# Patient Record
Sex: Male | Born: 1940
Health system: Southern US, Community
[De-identification: ages and names within clinical notes are randomized; demographics above are authoritative.]

## PROBLEM LIST (undated history)

## (undated) DIAGNOSIS — H269 Unspecified cataract: Secondary | ICD-10-CM

## (undated) DIAGNOSIS — G459 Transient cerebral ischemic attack, unspecified: Secondary | ICD-10-CM

## (undated) DIAGNOSIS — M1990 Unspecified osteoarthritis, unspecified site: Secondary | ICD-10-CM

## (undated) DIAGNOSIS — K5792 Diverticulitis of intestine, part unspecified, without perforation or abscess without bleeding: Secondary | ICD-10-CM

## (undated) DIAGNOSIS — J449 Chronic obstructive pulmonary disease, unspecified: Secondary | ICD-10-CM

## (undated) DIAGNOSIS — I10 Essential (primary) hypertension: Secondary | ICD-10-CM

## (undated) DIAGNOSIS — K5790 Diverticulosis of intestine, part unspecified, without perforation or abscess without bleeding: Secondary | ICD-10-CM

## (undated) DIAGNOSIS — E785 Hyperlipidemia, unspecified: Secondary | ICD-10-CM

## (undated) HISTORY — DX: Unspecified osteoarthritis, unspecified site: M19.90

## (undated) HISTORY — DX: Hyperlipidemia, unspecified: E78.5

## (undated) HISTORY — DX: Unspecified cataract: H26.9

## (undated) HISTORY — PX: COLONOSCOPY: SHX174

## (undated) HISTORY — DX: Diverticulitis of intestine, part unspecified, without perforation or abscess without bleeding: K57.92

## (undated) HISTORY — DX: Diverticulosis of intestine, part unspecified, without perforation or abscess without bleeding: K57.90

---

## 2006-07-30 HISTORY — PX: PENILE PROSTHESIS IMPLANT: SHX240

## 2011-09-12 DIAGNOSIS — G459 Transient cerebral ischemic attack, unspecified: Secondary | ICD-10-CM

## 2011-09-12 HISTORY — DX: Transient cerebral ischemic attack, unspecified: G45.9

## 2013-09-17 DIAGNOSIS — H2510 Age-related nuclear cataract, unspecified eye: Secondary | ICD-10-CM | POA: Diagnosis not present

## 2013-09-23 DIAGNOSIS — M47817 Spondylosis without myelopathy or radiculopathy, lumbosacral region: Secondary | ICD-10-CM | POA: Diagnosis not present

## 2013-09-23 DIAGNOSIS — H3400 Transient retinal artery occlusion, unspecified eye: Secondary | ICD-10-CM | POA: Diagnosis not present

## 2013-10-01 DIAGNOSIS — J449 Chronic obstructive pulmonary disease, unspecified: Secondary | ICD-10-CM | POA: Diagnosis not present

## 2013-10-01 DIAGNOSIS — J438 Other emphysema: Secondary | ICD-10-CM | POA: Diagnosis not present

## 2013-10-01 DIAGNOSIS — J0100 Acute maxillary sinusitis, unspecified: Secondary | ICD-10-CM | POA: Diagnosis not present

## 2013-10-01 DIAGNOSIS — R059 Cough, unspecified: Secondary | ICD-10-CM | POA: Diagnosis not present

## 2013-10-01 DIAGNOSIS — J209 Acute bronchitis, unspecified: Secondary | ICD-10-CM | POA: Diagnosis not present

## 2013-10-01 DIAGNOSIS — R05 Cough: Secondary | ICD-10-CM | POA: Diagnosis not present

## 2013-10-21 DIAGNOSIS — E7800 Pure hypercholesterolemia, unspecified: Secondary | ICD-10-CM | POA: Diagnosis not present

## 2013-10-21 DIAGNOSIS — I69998 Other sequelae following unspecified cerebrovascular disease: Secondary | ICD-10-CM | POA: Diagnosis not present

## 2013-10-21 DIAGNOSIS — R5381 Other malaise: Secondary | ICD-10-CM | POA: Diagnosis not present

## 2013-10-21 DIAGNOSIS — Z125 Encounter for screening for malignant neoplasm of prostate: Secondary | ICD-10-CM | POA: Diagnosis not present

## 2013-10-21 DIAGNOSIS — I10 Essential (primary) hypertension: Secondary | ICD-10-CM | POA: Diagnosis not present

## 2013-10-21 DIAGNOSIS — N529 Male erectile dysfunction, unspecified: Secondary | ICD-10-CM | POA: Diagnosis not present

## 2013-10-21 DIAGNOSIS — R351 Nocturia: Secondary | ICD-10-CM | POA: Diagnosis not present

## 2013-10-21 DIAGNOSIS — R5383 Other fatigue: Secondary | ICD-10-CM | POA: Diagnosis not present

## 2013-10-21 DIAGNOSIS — Z0000 Encounter for general adult medical examination without abnormal findings: Secondary | ICD-10-CM | POA: Diagnosis not present

## 2013-10-21 DIAGNOSIS — M159 Polyosteoarthritis, unspecified: Secondary | ICD-10-CM | POA: Diagnosis not present

## 2013-10-21 DIAGNOSIS — R9431 Abnormal electrocardiogram [ECG] [EKG]: Secondary | ICD-10-CM | POA: Diagnosis not present

## 2013-10-23 DIAGNOSIS — I10 Essential (primary) hypertension: Secondary | ICD-10-CM | POA: Diagnosis not present

## 2013-10-23 DIAGNOSIS — H60399 Other infective otitis externa, unspecified ear: Secondary | ICD-10-CM | POA: Diagnosis not present

## 2014-04-21 DIAGNOSIS — I10 Essential (primary) hypertension: Secondary | ICD-10-CM | POA: Diagnosis not present

## 2014-04-21 DIAGNOSIS — L500 Allergic urticaria: Secondary | ICD-10-CM | POA: Diagnosis not present

## 2014-04-28 DIAGNOSIS — L500 Allergic urticaria: Secondary | ICD-10-CM | POA: Diagnosis not present

## 2014-04-28 DIAGNOSIS — I10 Essential (primary) hypertension: Secondary | ICD-10-CM | POA: Diagnosis not present

## 2014-04-28 DIAGNOSIS — Z91038 Other insect allergy status: Secondary | ICD-10-CM | POA: Diagnosis not present

## 2014-07-02 DIAGNOSIS — Z23 Encounter for immunization: Secondary | ICD-10-CM | POA: Diagnosis not present

## 2014-09-01 DIAGNOSIS — G453 Amaurosis fugax: Secondary | ICD-10-CM | POA: Diagnosis not present

## 2014-09-18 DIAGNOSIS — H53022 Refractive amblyopia, left eye: Secondary | ICD-10-CM | POA: Diagnosis not present

## 2014-09-18 DIAGNOSIS — H43391 Other vitreous opacities, right eye: Secondary | ICD-10-CM | POA: Diagnosis not present

## 2014-09-18 DIAGNOSIS — H2513 Age-related nuclear cataract, bilateral: Secondary | ICD-10-CM | POA: Diagnosis not present

## 2014-09-18 DIAGNOSIS — H524 Presbyopia: Secondary | ICD-10-CM | POA: Diagnosis not present

## 2014-09-29 DIAGNOSIS — I10 Essential (primary) hypertension: Secondary | ICD-10-CM | POA: Diagnosis not present

## 2014-09-29 DIAGNOSIS — M542 Cervicalgia: Secondary | ICD-10-CM | POA: Diagnosis not present

## 2014-09-29 DIAGNOSIS — M25511 Pain in right shoulder: Secondary | ICD-10-CM | POA: Diagnosis not present

## 2014-10-12 DIAGNOSIS — R000 Tachycardia, unspecified: Secondary | ICD-10-CM | POA: Diagnosis not present

## 2014-10-12 DIAGNOSIS — R609 Edema, unspecified: Secondary | ICD-10-CM | POA: Diagnosis not present

## 2014-10-12 DIAGNOSIS — E780 Pure hypercholesterolemia: Secondary | ICD-10-CM | POA: Diagnosis not present

## 2014-10-12 DIAGNOSIS — I10 Essential (primary) hypertension: Secondary | ICD-10-CM | POA: Diagnosis not present

## 2014-10-12 DIAGNOSIS — J449 Chronic obstructive pulmonary disease, unspecified: Secondary | ICD-10-CM | POA: Diagnosis not present

## 2014-10-12 DIAGNOSIS — R0602 Shortness of breath: Secondary | ICD-10-CM | POA: Diagnosis not present

## 2014-10-12 DIAGNOSIS — R209 Unspecified disturbances of skin sensation: Secondary | ICD-10-CM | POA: Diagnosis not present

## 2014-10-12 DIAGNOSIS — N401 Enlarged prostate with lower urinary tract symptoms: Secondary | ICD-10-CM | POA: Diagnosis not present

## 2014-10-12 DIAGNOSIS — Z79899 Other long term (current) drug therapy: Secondary | ICD-10-CM | POA: Diagnosis not present

## 2014-10-12 DIAGNOSIS — R05 Cough: Secondary | ICD-10-CM | POA: Diagnosis not present

## 2014-10-12 DIAGNOSIS — Z8673 Personal history of transient ischemic attack (TIA), and cerebral infarction without residual deficits: Secondary | ICD-10-CM | POA: Diagnosis not present

## 2014-10-14 DIAGNOSIS — R609 Edema, unspecified: Secondary | ICD-10-CM | POA: Diagnosis not present

## 2014-10-14 DIAGNOSIS — R000 Tachycardia, unspecified: Secondary | ICD-10-CM | POA: Diagnosis not present

## 2014-10-26 DIAGNOSIS — I10 Essential (primary) hypertension: Secondary | ICD-10-CM | POA: Diagnosis not present

## 2014-10-26 DIAGNOSIS — K5790 Diverticulosis of intestine, part unspecified, without perforation or abscess without bleeding: Secondary | ICD-10-CM | POA: Diagnosis not present

## 2014-10-26 DIAGNOSIS — N400 Enlarged prostate without lower urinary tract symptoms: Secondary | ICD-10-CM | POA: Diagnosis not present

## 2014-10-26 DIAGNOSIS — Z0001 Encounter for general adult medical examination with abnormal findings: Secondary | ICD-10-CM | POA: Diagnosis not present

## 2014-10-26 DIAGNOSIS — E780 Pure hypercholesterolemia: Secondary | ICD-10-CM | POA: Diagnosis not present

## 2014-10-26 DIAGNOSIS — Z8673 Personal history of transient ischemic attack (TIA), and cerebral infarction without residual deficits: Secondary | ICD-10-CM | POA: Diagnosis not present

## 2014-10-26 DIAGNOSIS — N529 Male erectile dysfunction, unspecified: Secondary | ICD-10-CM | POA: Diagnosis not present

## 2014-10-26 DIAGNOSIS — R635 Abnormal weight gain: Secondary | ICD-10-CM | POA: Diagnosis not present

## 2014-10-26 DIAGNOSIS — M150 Primary generalized (osteo)arthritis: Secondary | ICD-10-CM | POA: Diagnosis not present

## 2014-10-30 DIAGNOSIS — I361 Nonrheumatic tricuspid (valve) insufficiency: Secondary | ICD-10-CM | POA: Diagnosis not present

## 2014-10-30 DIAGNOSIS — I517 Cardiomegaly: Secondary | ICD-10-CM | POA: Diagnosis not present

## 2014-11-14 DIAGNOSIS — S61256A Open bite of right little finger without damage to nail, initial encounter: Secondary | ICD-10-CM | POA: Diagnosis not present

## 2014-11-14 DIAGNOSIS — S60571A Other superficial bite of hand of right hand, initial encounter: Secondary | ICD-10-CM | POA: Diagnosis not present

## 2014-11-14 DIAGNOSIS — W540XXA Bitten by dog, initial encounter: Secondary | ICD-10-CM | POA: Diagnosis not present

## 2014-11-14 DIAGNOSIS — Z87820 Personal history of traumatic brain injury: Secondary | ICD-10-CM | POA: Diagnosis not present

## 2014-11-14 DIAGNOSIS — S61250A Open bite of right index finger without damage to nail, initial encounter: Secondary | ICD-10-CM | POA: Diagnosis not present

## 2014-11-14 DIAGNOSIS — S62660B Nondisplaced fracture of distal phalanx of right index finger, initial encounter for open fracture: Secondary | ICD-10-CM | POA: Diagnosis not present

## 2014-11-14 DIAGNOSIS — I10 Essential (primary) hypertension: Secondary | ICD-10-CM | POA: Diagnosis not present

## 2014-11-14 DIAGNOSIS — S61252A Open bite of right middle finger without damage to nail, initial encounter: Secondary | ICD-10-CM | POA: Diagnosis not present

## 2014-11-14 DIAGNOSIS — S62600A Fracture of unspecified phalanx of right index finger, initial encounter for closed fracture: Secondary | ICD-10-CM | POA: Diagnosis not present

## 2014-11-14 DIAGNOSIS — Y999 Unspecified external cause status: Secondary | ICD-10-CM | POA: Diagnosis not present

## 2014-11-25 DIAGNOSIS — S62630D Displaced fracture of distal phalanx of right index finger, subsequent encounter for fracture with routine healing: Secondary | ICD-10-CM | POA: Insufficient documentation

## 2014-12-17 DIAGNOSIS — S62630D Displaced fracture of distal phalanx of right index finger, subsequent encounter for fracture with routine healing: Secondary | ICD-10-CM | POA: Diagnosis not present

## 2015-06-30 DIAGNOSIS — Z23 Encounter for immunization: Secondary | ICD-10-CM | POA: Diagnosis not present

## 2015-08-27 DIAGNOSIS — I6529 Occlusion and stenosis of unspecified carotid artery: Secondary | ICD-10-CM | POA: Diagnosis not present

## 2015-08-27 DIAGNOSIS — G453 Amaurosis fugax: Secondary | ICD-10-CM | POA: Diagnosis not present

## 2015-09-01 ENCOUNTER — Emergency Department (HOSPITAL_BASED_OUTPATIENT_CLINIC_OR_DEPARTMENT_OTHER): Payer: Medicare Other

## 2015-09-01 ENCOUNTER — Encounter (HOSPITAL_BASED_OUTPATIENT_CLINIC_OR_DEPARTMENT_OTHER): Payer: Self-pay | Admitting: *Deleted

## 2015-09-01 ENCOUNTER — Emergency Department (HOSPITAL_BASED_OUTPATIENT_CLINIC_OR_DEPARTMENT_OTHER)
Admission: EM | Admit: 2015-09-01 | Discharge: 2015-09-01 | Disposition: A | Discharge status: Home / Self Care | Payer: Medicare Other | Attending: Emergency Medicine | Admitting: Emergency Medicine

## 2015-09-01 DIAGNOSIS — Z791 Long term (current) use of non-steroidal anti-inflammatories (NSAID): Secondary | ICD-10-CM | POA: Insufficient documentation

## 2015-09-01 DIAGNOSIS — Z79899 Other long term (current) drug therapy: Secondary | ICD-10-CM | POA: Insufficient documentation

## 2015-09-01 DIAGNOSIS — R1032 Left lower quadrant pain: Secondary | ICD-10-CM | POA: Diagnosis present

## 2015-09-01 DIAGNOSIS — Z8673 Personal history of transient ischemic attack (TIA), and cerebral infarction without residual deficits: Secondary | ICD-10-CM | POA: Diagnosis not present

## 2015-09-01 DIAGNOSIS — Z7902 Long term (current) use of antithrombotics/antiplatelets: Secondary | ICD-10-CM | POA: Insufficient documentation

## 2015-09-01 DIAGNOSIS — K5732 Diverticulitis of large intestine without perforation or abscess without bleeding: Secondary | ICD-10-CM | POA: Diagnosis not present

## 2015-09-01 DIAGNOSIS — J449 Chronic obstructive pulmonary disease, unspecified: Secondary | ICD-10-CM | POA: Diagnosis not present

## 2015-09-01 DIAGNOSIS — I10 Essential (primary) hypertension: Secondary | ICD-10-CM | POA: Diagnosis not present

## 2015-09-01 HISTORY — DX: Transient cerebral ischemic attack, unspecified: G45.9

## 2015-09-01 HISTORY — DX: Chronic obstructive pulmonary disease, unspecified: J44.9

## 2015-09-01 HISTORY — DX: Essential (primary) hypertension: I10

## 2015-09-01 LAB — BASIC METABOLIC PANEL
Anion gap: 7 (ref 5–15)
BUN: 14 mg/dL (ref 6–20)
CALCIUM: 8.7 mg/dL — AB (ref 8.9–10.3)
CHLORIDE: 103 mmol/L (ref 101–111)
CO2: 26 mmol/L (ref 22–32)
CREATININE: 0.9 mg/dL (ref 0.61–1.24)
GFR calc non Af Amer: >60 mL/min (ref >60)
Glucose, Bld: 120 mg/dL — ABNORMAL HIGH (ref 65–99)
Potassium: 4.1 mmol/L (ref 3.5–5.1)
SODIUM: 136 mmol/L (ref 135–145)

## 2015-09-01 LAB — CBC WITH DIFFERENTIAL/PLATELET
BASOS PCT: 0 %
Basophils Absolute: 0 10*3/uL (ref 0.0–0.1)
EOS ABS: 0.2 10*3/uL (ref 0.0–0.7)
EOS PCT: 3 %
HCT: 44.3 % (ref 39.0–52.0)
HEMOGLOBIN: 14.9 g/dL (ref 13.0–17.0)
LYMPHS ABS: 1 10*3/uL (ref 0.7–4.0)
Lymphocytes Relative: 12 %
MCH: 31.6 pg (ref 26.0–34.0)
MCHC: 33.6 g/dL (ref 30.0–36.0)
MCV: 94.1 fL (ref 78.0–100.0)
MONO ABS: 1.2 10*3/uL — AB (ref 0.1–1.0)
MONOS PCT: 14 %
NEUTROS PCT: 71 %
Neutro Abs: 5.9 10*3/uL (ref 1.7–7.7)
PLATELETS: 295 10*3/uL (ref 150–400)
RBC: 4.71 MIL/uL (ref 4.22–5.81)
RDW: 12.2 % (ref 11.5–15.5)
WBC: 8.3 10*3/uL (ref 4.0–10.5)

## 2015-09-01 LAB — URINALYSIS, ROUTINE W REFLEX MICROSCOPIC
BILIRUBIN URINE: NEGATIVE
Glucose, UA: NEGATIVE mg/dL
HGB URINE DIPSTICK: NEGATIVE
KETONES UR: NEGATIVE mg/dL
Leukocytes, UA: NEGATIVE
NITRITE: NEGATIVE
PROTEIN: NEGATIVE mg/dL
SPECIFIC GRAVITY, URINE: 1.013 (ref 1.005–1.030)
pH: 6 (ref 5.0–8.0)

## 2015-09-01 MED ORDER — IOHEXOL 300 MG/ML  SOLN
100.0000 mL | Freq: Once | INTRAMUSCULAR | Status: AC | PRN
  Administered 2015-09-01: 100 mL via INTRAVENOUS

## 2015-09-01 MED ORDER — CIPROFLOXACIN HCL 500 MG PO TABS
500.0000 mg | ORAL_TABLET | Freq: Two times a day (BID) | ORAL | Status: AC
Start: 2015-09-01 — End: 2015-09-10

## 2015-09-01 MED ORDER — HYDROCODONE-ACETAMINOPHEN 5-325 MG PO TABS: 1.0000 | ORAL_TABLET | ORAL | Status: DC | PRN

## 2015-09-01 MED ORDER — METRONIDAZOLE 500 MG PO TABS: 500.0000 mg | ORAL_TABLET | Freq: Two times a day (BID) | ORAL | Status: AC

## 2015-09-01 MED ORDER — IOHEXOL 300 MG/ML  SOLN
25.0000 mL | Freq: Once | INTRAMUSCULAR | Status: AC | PRN
  Administered 2015-09-01: 25 mL via ORAL

## 2015-09-01 NOTE — ED Provider Notes (Signed)
CSN: [id number]     Arrival date & time 09/01/15  1126 History   First MD Initiated Contact with Patient 09/01/15 1135     No chief complaint on file.   HPI  Patient presents with concern of new abdominal pain. The pain began 4 days ago, since onset has been worsening. The pain is focally in the left lower quadrant, with no radiation. There is no shift nausea, vomiting, diarrhea, dysuria, hematuria. Patient has a history of prior penile implant. This was about 10 years ago. Since onset, no clear alleviating or exacerbating factors.   Past Medical History  Diagnosis Date  . Hypertension   . TIA (transient ischemic attack)   . COPD (chronic obstructive pulmonary disease) [facility])    Past Surgical History  Procedure Laterality Date  . Penile prosthesis implant     No family history on file. Social History  Substance Use Topics  . Smoking status: Never Smoker   . Smokeless tobacco: None  . Alcohol Use: None    Review of Systems  Constitutional:       Per HPI, otherwise negative  HENT:       Per HPI, otherwise negative  Respiratory:       Per HPI, otherwise negative  Cardiovascular:       Per HPI, otherwise negative  Gastrointestinal: Negative for vomiting.  Endocrine:       Negative aside from HPI  Genitourinary:       Neg aside from HPI   Musculoskeletal:       Per HPI, otherwise negative  Skin: Negative.   Neurological: Negative for syncope.      Allergies  Morphine and related  Home Medications   Prior to Admission medications   Medication Sig Start Date End Date Taking? Authorizing Provider  amLODipine (NORVASC) 5 MG tablet Take 5 mg by mouth daily.   Yes Historical Provider, MD  ciprofloxacin (CIPRO) 500 MG tablet Take 1 tablet (500 mg total) by mouth 2 (two) times daily. 09/01/15 09/10/15  Carmin Muskrat, MD  clopidogrel (PLAVIX) 75 MG tablet Take 75 mg by mouth daily.   Yes Historical Provider, MD  HYDROcodone-acetaminophen (NORCO/VICODIN) 5-325 MG  tablet Take 1 tablet by mouth every 4 (four) hours as needed for severe pain. 09/01/15   Carmin Muskrat, MD  meloxicam (MOBIC) 15 MG tablet Take 15 mg by mouth daily.   Yes Historical Provider, MD  metroNIDAZOLE (FLAGYL) 500 MG tablet Take 1 tablet (500 mg total) by mouth 2 (two) times daily. 09/01/15 09/11/15  Carmin Muskrat, MD  rosuvastatin (CRESTOR) 10 MG tablet Take 10 mg by mouth daily.   Yes Historical Provider, MD   BP 146/89 mmHg  Pulse 94  Temp(Src) 98.3 F (36.8 C) (Oral)  Resp 20  Ht 5\' 8"  (1.727 m)  Wt 198 lb (89.812 kg)  BMI 30.11 kg/m2  SpO2 98% Physical Exam  Constitutional: He is oriented to person, place, and time. He appears well-developed. No distress.  HENT:  Head: Normocephalic and atraumatic.  Eyes: Conjunctivae and EOM are normal.  Cardiovascular: Normal rate and regular rhythm.   Pulmonary/Chest: Effort normal. No stridor. No respiratory distress.  Abdominal: He exhibits no distension. There is tenderness in the left lower quadrant. There is guarding. There is no rigidity.  Musculoskeletal: He exhibits no edema.  Neurological: He is alert and oriented to person, place, and time.  Skin: Skin is warm and dry.  Psychiatric: He has a normal mood and affect.  Nursing note and vitals  reviewed.   ED Course  Procedures (including critical care time) Labs Review Labs Reviewed  BASIC METABOLIC PANEL - Abnormal; Notable for the following:    Glucose, Bld 120 (*)    Calcium 8.7 (*)    All other components within normal limits  CBC WITH DIFFERENTIAL/PLATELET - Abnormal; Notable for the following:    Monocytes Absolute 1.2 (*)    All other components within normal limits  URINALYSIS, ROUTINE W REFLEX MICROSCOPIC (NOT AT [facility])    Imaging Review Ct Abdomen Pelvis W Contrast  09/01/2015  CLINICAL DATA:  Left lower quadrant pain for 3 days EXAM: CT ABDOMEN AND PELVIS WITH CONTRAST TECHNIQUE: Multidetector CT imaging of the abdomen and pelvis was performed using  the standard protocol following bolus administration of intravenous contrast. CONTRAST:  138mL OMNIPAQUE IOHEXOL 300 MG/ML SOLN, 40mL OMNIPAQUE IOHEXOL 300 MG/ML SOLN COMPARISON:  None. FINDINGS: The lung bases are free of acute infiltrate or sizable effusion. The liver, gallbladder, spleen, left adrenal gland and pancreas are within normal limits with the exception of a small hepatic cyst. A small nodule is noted in the right adrenal gland best seen on image number 25 of series 2 measuring less than 1 cm and likely representing a small adenoma. Kidneys are well visualized bilaterally. No renal calculi or obstructive changes are noted. No abnormal enhancement is seen. The appendix is within normal limits. Diverticular change of the colon is seen with evidence of diverticulitis in the sigmoid colon proximally. No micro perforation or abscess is identified at this time. The bladder is well distended. Changes of penile prosthesis are seen. No pelvic mass lesion is noted. Mild aortic calcifications are seen. Degenerative changes of the lumbar spine are noted. IMPRESSION: Diverticulitis within the sigmoid colon without evidence of abscess or perforation. Likely small right adrenal adenoma. Tiny cyst within the liver. Electronically Signed   By: Inez Catalina M.D.   On: 09/01/2015 13:44   I have personally reviewed and evaluated these images and lab results as part of my medical decision-making.  2:08 PM On repeat exam the patient is awake, alert, aware of all findings.  We discussed return precautions, INSTRUCTIONS at length.   MDM   Final diagnoses:  Diverticulitis of large intestine without perforation or abscess without bleeding   Patient presents with persistent left lower quadrant abdominal pain. Here, the patient is afebrile, with no evidence for peritonitis. With his history of prior procedures, broad differential was considered. CT findings are consistent with diverticulitis. Patient aware of all  results, will follow-up with gastroenterology, after initiation of antibiotics analgesia.   Carmin Muskrat, MD 09/01/15 1409

## 2015-09-01 NOTE — Discharge Instructions (Signed)
Diverticulitis Diverticulitis is when small pockets that have formed in your colon (large intestine) become infected or swollen. HOME CARE  Follow your doctor's instructions.  Follow a special diet if told by your doctor.  When you feel better, your doctor may tell you to change your diet. You may be told to eat a lot of fiber. Fruits and vegetables are good sources of fiber. Fiber makes it easier to poop (have bowel movements).  Take supplements or probiotics as told by your doctor.  Only take medicines as told by your doctor.  Keep all follow-up visits with your doctor. GET HELP IF:  Your pain does not get better.  You have a hard time eating food.  You are not pooping like normal. GET HELP RIGHT AWAY IF:  Your pain gets worse.  Your problems do not get better.  Your problems suddenly get worse.  You have a fever.  You keep throwing up (vomiting).  You have bloody or Smithey, tarry poop (stool). MAKE SURE YOU:   Understand these instructions.  Will watch your condition.  Will get help right away if you are not doing well or get worse.   This information is not intended to replace advice given to you by your health care provider. Make sure you discuss any questions you have with your health care provider.   Document Released: 02/14/2008 Document Revised: 09/02/2013 Document Reviewed: 07/23/2013

## 2015-09-01 NOTE — ED Notes (Signed)
C/o lower left abd pain with tenderness on palpation that started on Sunday. No n/v/d. No problems urinating. No hx of kidney stones.

## 2015-09-02 ENCOUNTER — Telehealth: Payer: Self-pay | Admitting: Gastroenterology

## 2015-09-02 NOTE — Telephone Encounter (Signed)
Spoke with the spouse. Appointment scheduled with Connor Johnson. Patient does not have any GI history. He has never had a colonoscopy.

## 2015-09-16 ENCOUNTER — Encounter: Payer: Self-pay | Admitting: Physician Assistant

## 2015-09-16 ENCOUNTER — Ambulatory Visit (INDEPENDENT_AMBULATORY_CARE_PROVIDER_SITE_OTHER): Payer: Medicare Other | Admitting: Physician Assistant

## 2015-09-16 VITALS — BP 134/60 | HR 104 | Ht 66.5 in | Wt 203.2 lb

## 2015-09-16 DIAGNOSIS — G459 Transient cerebral ischemic attack, unspecified: Secondary | ICD-10-CM | POA: Diagnosis not present

## 2015-09-16 DIAGNOSIS — K5732 Diverticulitis of large intestine without perforation or abscess without bleeding: Secondary | ICD-10-CM

## 2015-09-16 DIAGNOSIS — I10 Essential (primary) hypertension: Secondary | ICD-10-CM | POA: Diagnosis not present

## 2015-09-16 DIAGNOSIS — Z1211 Encounter for screening for malignant neoplasm of colon: Secondary | ICD-10-CM | POA: Diagnosis not present

## 2015-09-16 MED ORDER — NA SULFATE-K SULFATE-MG SULF 17.5-3.13-1.6 GM/177ML PO SOLN: 1.0000 | Freq: Once | ORAL | Status: DC

## 2015-09-16 NOTE — Progress Notes (Signed)
Agree with assessment and plan as outlined.  

## 2015-09-16 NOTE — Patient Instructions (Addendum)
You have been scheduled for a colonoscopy. Please follow written instructions given to you at your visit today.  Please pick up your prep supplies at the pharmacy within the next 1-3 days. [place]. If you use inhalers (even only as needed), please bring them with you on the day of your procedure. Your physician has requested that you go to www.startemmi.com and enter the access code given to you at your visit today. This web site gives a general overview about your procedure. However, you should still follow specific instructions given to you by our office regarding your preparation for the procedure.  We will call you once we hear from Dr. Glendon Axe , [facility].

## 2015-09-16 NOTE — Progress Notes (Signed)
Patient ID: Jerson Furukawa, male   DOB: Mar 27, 1941, 75 y.o.   MRN: [id number]   Subjective:    Patient ID: Rennis Harding, male    DOB: June 25, 1941, 75 y.o.   MRN: [id number]  HPI  Ricahrd is a pleasant 75 year old white male new to GI today and seen after recent ER evaluation with diagnosis of acute diverticulitis. Patient has not had any prior GI evaluation. He thinks he may have had a sigmoid exam 15-20 years ago done by a primary care physician.  patient had an ER visit on 09/01/2015 with 4 day history of lower abdominal pain primarily in the left lower quadrant which was progressive. He had CT of the abdomen and pelvis done at that time which showed a 1 cm right adrenal cyst and uncomplicated sigmoid diverticulitis. WBC was 8.3 hemoglobin 14.9 hematocrit of 44.3. He was placed on a course of Cipro 500 twice a day and Flagyl twice a day 10 days which she has now completed.  He says initially his pain was and "8" and now it is about completely gone. He did have some loose stools while on the antibiotics and that has improved as well Again no residual discomfort no melena or hematochezia.   patient does have history of a TIA 6-7 years ago and has been maintained on Plavix since. He is followed by Dr. Chauncey [facility] neurology. Other medical problems include hypertension and COPD.  Review of Systems Pertinent positive and negative review of systems were noted in the above HPI section.  All other review of systems was otherwise negative.  Outpatient Encounter Prescriptions as of 09/16/2015  Medication Sig  . amLODipine (NORVASC) 5 MG tablet Take 5 mg by mouth daily.  . clopidogrel (PLAVIX) 75 MG tablet Take 75 mg by mouth daily.  . meloxicam (MOBIC) 15 MG tablet Take 15 mg by mouth daily.  . Na Sulfate-K Sulfate-Mg Sulf SOLN Take 1 kit by mouth once.  . rosuvastatin (CRESTOR) 10 MG tablet Take 10 mg by mouth daily.  . [DISCONTINUED] HYDROcodone-acetaminophen (NORCO/VICODIN) 5-325 MG tablet Take 1 tablet  by mouth every 4 (four) hours as needed for severe pain.   No facility-administered encounter medications on file as of 09/16/2015.   Allergies  Allergen Reactions  . Morphine And Related Other (See Comments)    Tremors and delirious  . Hctz [Hydrochlorothiazide] Rash   Patient Active Problem List   Diagnosis Date Noted  . HTN (hypertension) 09/16/2015  . Diverticulitis of colon 09/16/2015  . TIA (transient ischemic attack) 09/16/2015   Social History   Social History  . Marital Status: Married    Spouse Name: N/A  . Number of Children: 3  . Years of Education: N/A   Occupational History  . retired    Social History Main Topics  . Smoking status: Former Smoker    Types: Cigarettes    Quit date: 09/16/1995  . Smokeless tobacco: Never Used  . Alcohol Use: No  . Drug Use: No  . Sexual Activity: Not on file   Other Topics Concern  . Not on file   Social History Narrative    Mr. Degollado family history includes Diabetes in his brother; Heart disease in his paternal grandmother; Liver cancer in his father; Prostate cancer in his brother.      Objective:    Filed Vitals:   09/16/15 1326  BP: 134/60  Pulse: 104    Physical Exam   Well-developed older white male in no acute distress, accompanied by  his wife blood pressure 134/60 pulse 104 height 5 foot 6 weight 203. HEENT; nontraumatic normocephalic EOMI PERRLA sclera anicteric , cardiovascular regular rate and rhythm with S1-S2 no murmur or gallop, Pulmonary; clear bilaterally, Abdomen; large soft basically nontender no palpable mass or hepatosplenomegaly bowel sounds are present, Rectal; exam not done, Ext; no clubbing cyanosis or edema skin warm and dry, Neuropsych; mood and affect appropriate       Assessment & Plan:    #1 75 yo male with recent acute sigmoid diverticulitis- resolved. This was his first episode #2 Colon neoplasia screening- no prior colonoscopy #3 HTN #4 COPD- no meds or oxygen #5 Hx TIA -6-7  years ago - on Plavix  Plan;  High fiber diet, avoid popcorn and nuts Discussed Diverticular disease and diverticulitis Schedule for Colonoscopy with Dr Havery Moros (pt request) >procedure discussed in detail  with pt and he is agreeable to proceed. He is advised to call for any recurrence of symptoms in the interim. We will communicate with his  Neurologist Dr. Chauncey [facility] neurology  To assure that holding Plavix for 5 days prior to colonoscopy is reasonable for this patient. He may take  baby aspirin.   Patrich Heinze S Janayah Zavada PA-C 09/16/2015   Cc: Charleston Poot, MD

## 2015-09-21 DIAGNOSIS — H5203 Hypermetropia, bilateral: Secondary | ICD-10-CM | POA: Diagnosis not present

## 2015-09-21 DIAGNOSIS — H2513 Age-related nuclear cataract, bilateral: Secondary | ICD-10-CM | POA: Diagnosis not present

## 2015-09-21 DIAGNOSIS — H43391 Other vitreous opacities, right eye: Secondary | ICD-10-CM | POA: Diagnosis not present

## 2015-09-21 DIAGNOSIS — H53022 Refractive amblyopia, left eye: Secondary | ICD-10-CM | POA: Diagnosis not present

## 2015-09-22 ENCOUNTER — Telehealth: Payer: Self-pay | Admitting: *Deleted

## 2015-09-22 NOTE — Telephone Encounter (Signed)
Advised the patients wife he can hold the Plavix today and resume it the day after the procedure unless told otherwise by Dr. Havery Moros.  I just got the fax from Dr. Marijean Bravo at [facility]. Off Plavix 5 days prior to procedure.   ( DPR signed for [employer], patient's wife ).

## 2015-09-27 ENCOUNTER — Encounter: Payer: Self-pay | Admitting: Gastroenterology

## 2015-09-27 ENCOUNTER — Ambulatory Visit (AMBULATORY_SURGERY_CENTER): Payer: Medicare Other | Admitting: Gastroenterology

## 2015-09-27 VITALS — BP 132/82 | HR 77 | Temp 97.4°F | Resp 22 | Ht 66.5 in | Wt 203.0 lb

## 2015-09-27 DIAGNOSIS — D125 Benign neoplasm of sigmoid colon: Secondary | ICD-10-CM | POA: Diagnosis not present

## 2015-09-27 DIAGNOSIS — Z1211 Encounter for screening for malignant neoplasm of colon: Secondary | ICD-10-CM | POA: Diagnosis not present

## 2015-09-27 DIAGNOSIS — D128 Benign neoplasm of rectum: Secondary | ICD-10-CM

## 2015-09-27 DIAGNOSIS — D124 Benign neoplasm of descending colon: Secondary | ICD-10-CM

## 2015-09-27 DIAGNOSIS — K5732 Diverticulitis of large intestine without perforation or abscess without bleeding: Secondary | ICD-10-CM | POA: Diagnosis not present

## 2015-09-27 DIAGNOSIS — D120 Benign neoplasm of cecum: Secondary | ICD-10-CM | POA: Diagnosis not present

## 2015-09-27 DIAGNOSIS — D123 Benign neoplasm of transverse colon: Secondary | ICD-10-CM | POA: Diagnosis not present

## 2015-09-27 DIAGNOSIS — D122 Benign neoplasm of ascending colon: Secondary | ICD-10-CM

## 2015-09-27 DIAGNOSIS — Z8673 Personal history of transient ischemic attack (TIA), and cerebral infarction without residual deficits: Secondary | ICD-10-CM | POA: Diagnosis not present

## 2015-09-27 DIAGNOSIS — I10 Essential (primary) hypertension: Secondary | ICD-10-CM | POA: Diagnosis not present

## 2015-09-27 DIAGNOSIS — J449 Chronic obstructive pulmonary disease, unspecified: Secondary | ICD-10-CM | POA: Diagnosis not present

## 2015-09-27 LAB — HM COLONOSCOPY

## 2015-09-27 MED ORDER — SODIUM CHLORIDE 0.9 % IV SOLN: 500.0000 mL | INTRAVENOUS | Status: DC

## 2015-09-27 NOTE — Progress Notes (Signed)
Called to room to assist during endoscopic procedure.  Patient ID and intended procedure confirmed with present staff. Received instructions for my participation in the procedure from the performing physician.  

## 2015-09-27 NOTE — Patient Instructions (Signed)
Discharge instructions given. Handouts on polyps and diverticulosis. Resume PLavix in 3 days. Hold all other NSAIDS including meloxicam for  2 weeks. Resume other medications today. YOU HAD AN ENDOSCOPIC PROCEDURE TODAY AT [facility]:   Refer to the procedure report that was given to you for any specific questions about what was found during the examination.  If the procedure report does not answer your questions, please call your gastroenterologist to clarify.  If you requested that your care partner not be given the details of your procedure findings, then the procedure report has been included in a sealed envelope for you to review at your convenience later.  YOU SHOULD EXPECT: Some feelings of bloating in the abdomen. Passage of more gas than usual.  Walking can help get rid of the air that was put into your GI tract during the procedure and reduce the bloating. If you had a lower endoscopy (such as a colonoscopy or flexible sigmoidoscopy) you may notice spotting of blood in your stool or on the toilet paper. If you underwent a bowel prep for your procedure, you may not have a normal bowel movement for a few days.  Please Note:  You might notice some irritation and congestion in your nose or some drainage.  This is from the oxygen used during your procedure.  There is no need for concern and it should clear up in a day or so.  SYMPTOMS TO REPORT IMMEDIATELY:   Following lower endoscopy (colonoscopy or flexible sigmoidoscopy):  Excessive amounts of blood in the stool  Significant tenderness or worsening of abdominal pains  Swelling of the abdomen that is new, acute  Fever of 100F or higher   For urgent or emergent issues, a gastroenterologist can be reached at any hour by calling [phone].   DIET: Your first meal following the procedure should be a small meal and then it is ok to progress to your normal diet. Heavy or fried foods are harder to digest and may make  you feel nauseous or bloated.  Likewise, meals heavy in dairy and vegetables can increase bloating.  Drink plenty of fluids but you should avoid alcoholic beverages for 24 hours.  ACTIVITY:  You should plan to take it easy for the rest of today and you should NOT DRIVE or use heavy machinery until tomorrow (because of the sedation medicines used during the test).    FOLLOW UP: Our staff will call the number listed on your records the next business day following your procedure to check on you and address any questions or concerns that you may have regarding the information given to you following your procedure. If we do not reach you, we will leave a message.  However, if you are feeling well and you are not experiencing any problems, there is no need to return our call.  We will assume that you have returned to your regular daily activities without incident.  If any biopsies were taken you will be contacted by phone or by letter within the next 1-3 weeks.  Please call us at [phone] if you have not heard about the biopsies in 3 weeks.    SIGNATURES/CONFIDENTIALITY: You and/or your care partner have signed paperwork which will be entered into your electronic medical record.  These signatures attest to the fact that that the information above on your After Visit Summary has been reviewed and is understood.  Full responsibility of the confidentiality of this discharge information lies with you and/or  your care-partner. 

## 2015-09-27 NOTE — Op Note (Signed)
[place]  [address] [place], [address]   COLONOSCOPY PROCEDURE REPORT  PATIENT: Connor Johnson, Connor Johnson  MR#: [id number] BIRTHDATE: [birth year] , 74  yrs. old GENDER: male ENDOSCOPIST: Yetta Flock, MD REFERRED BY: Scarlette Calico MD PROCEDURE DATE:  09/27/2015 PROCEDURE:   Colonoscopy, screening, Colonoscopy with snare polypectomy, and Colonoscopy with biopsy First Screening Colonoscopy - Avg.  risk and is 50 yrs.  old or older Yes.  Prior Negative Screening - Now for repeat screening. N/A  History of Adenoma - Now for follow-up colonoscopy & has been > or = to 3 yrs.  N/A  Polyps removed today? Yes ASA CLASS:   Class III INDICATIONS:Screening for colonic neoplasia and Colorectal Neoplasm Risk Assessment for this procedure is average risk.  First time colonoscopy. He also has a history of diverticulitis on CT scan in December 2016. MEDICATIONS: Propofol 260 mg IV and Lidocaine 150 mg IV  DESCRIPTION OF PROCEDURE:   After the risks benefits and alternatives of the procedure were thoroughly explained, informed consent was obtained.  The digital rectal exam revealed no abnormalities of the rectum.   The [id number]  endoscope was introduced through the anus and advanced to the cecum, which was identified by both the appendix and ileocecal valve. No adverse events experienced.   The quality of the prep was adequate  The instrument was then slowly withdrawn as the colon was fully examined. Estimated blood loss is zero unless otherwise noted in this procedure report.      COLON FINDINGS: Three cecal sessile polyps, roughly 3 mm in size, were noted and removed with cold forceps.  Two sessile polyps, roughly 4-52mm were noted in the cecum and removed via cold snare. A 72mm sessile ascending colon polyp was noted and removed with cold forceps.  A 40mm sessile polyp was noted in the transverse colon and removed with cold forceps.  A 65mm sessile polyp was noted  in the descending colon and removed with cold snare.  A 6-8mm sessile polyp was noted in the sigmoid colon and removed with cold snare. A 3 mm sessile polyp was noted in the sigmoid colon and removed with cold forceps.  A 22mm sessile rectal polyp was noted and removed via cold snare.  Severe diverticulosis was noted in the left colon, mild diverticulosis noted in the right colon. Retroflexed views revealed internal hemorrhoids. The time to cecum = 2.1 Withdrawal time = 27.5   The scope was withdrawn and the procedure completed. COMPLICATIONS: There were no immediate complications.  ENDOSCOPIC IMPRESSION: 11 colon polyps, all removed via cold technique as outlined above Diverticulosis which correlates to the patient's prior diagnosis of diverticulitis Hemorrhoids  RECOMMENDATIONS: Resume plavix in 3 days Hold all other NSAIDS (including meloxicam) for 2 weeks High fiber diet Resume medications Await pathology results  eSigned:  Yetta Flock, MD 09/27/2015 10:05 AM   cc: Scarlette Calico MD, the patient   PATIENT NAME:  Connor Johnson, Connor Johnson MR#: [id number]

## 2015-09-27 NOTE — Progress Notes (Signed)
Report to PACU, RN, vss, BBS= Clear.  

## 2015-09-28 ENCOUNTER — Telehealth: Payer: Self-pay | Admitting: *Deleted

## 2015-09-28 ENCOUNTER — Telehealth: Payer: Self-pay | Admitting: Internal Medicine

## 2015-09-28 NOTE — Telephone Encounter (Signed)
Pt request to be Dr. Ronnald Ramp new pt. Please advise?   Phone # [phone]

## 2015-09-28 NOTE — Telephone Encounter (Signed)
Message left

## 2015-09-30 ENCOUNTER — Encounter: Payer: Self-pay | Admitting: Gastroenterology

## 2015-10-01 NOTE — Telephone Encounter (Signed)
Yes

## 2015-10-18 ENCOUNTER — Other Ambulatory Visit (INDEPENDENT_AMBULATORY_CARE_PROVIDER_SITE_OTHER): Payer: Medicare Other

## 2015-10-18 ENCOUNTER — Ambulatory Visit (INDEPENDENT_AMBULATORY_CARE_PROVIDER_SITE_OTHER): Payer: Medicare Other | Admitting: Internal Medicine

## 2015-10-18 ENCOUNTER — Encounter: Payer: Self-pay | Admitting: Internal Medicine

## 2015-10-18 VITALS — BP 120/88 | HR 80 | Temp 98.1°F | Resp 16 | Ht 66.5 in | Wt 200.0 lb

## 2015-10-18 DIAGNOSIS — E785 Hyperlipidemia, unspecified: Secondary | ICD-10-CM

## 2015-10-18 DIAGNOSIS — R7303 Prediabetes: Secondary | ICD-10-CM | POA: Insufficient documentation

## 2015-10-18 DIAGNOSIS — G451 Carotid artery syndrome (hemispheric): Secondary | ICD-10-CM

## 2015-10-18 DIAGNOSIS — R739 Hyperglycemia, unspecified: Secondary | ICD-10-CM

## 2015-10-18 DIAGNOSIS — I10 Essential (primary) hypertension: Secondary | ICD-10-CM

## 2015-10-18 LAB — URINALYSIS, ROUTINE W REFLEX MICROSCOPIC
BILIRUBIN URINE: NEGATIVE
KETONES UR: NEGATIVE
LEUKOCYTES UA: NEGATIVE
Nitrite: NEGATIVE
PH: 6.5 (ref 5.0–8.0)
Specific Gravity, Urine: <=1.005 — AB (ref 1.000–1.030)
TOTAL PROTEIN, URINE-UPE24: NEGATIVE
UROBILINOGEN UA: 0.2 (ref 0.0–1.0)
Urine Glucose: NEGATIVE

## 2015-10-18 LAB — BASIC METABOLIC PANEL
BUN: 14 mg/dL (ref 6–23)
CO2: 27 mEq/L (ref 19–32)
Calcium: 9.6 mg/dL (ref 8.4–10.5)
Chloride: 104 mEq/L (ref 96–112)
Creatinine, Ser: 0.9 mg/dL (ref 0.40–1.50)
GFR: 87.98 mL/min (ref >60.00)
Glucose, Bld: 100 mg/dL — ABNORMAL HIGH (ref 70–99)
POTASSIUM: 4.3 meq/L (ref 3.5–5.1)
SODIUM: 139 meq/L (ref 135–145)

## 2015-10-18 LAB — LIPID PANEL
CHOL/HDL RATIO: 2
Cholesterol: 152 mg/dL (ref 0–200)
HDL: 69.8 mg/dL (ref >39.00)
LDL Cholesterol: 66 mg/dL (ref 0–99)
NONHDL: 81.75
Triglycerides: 77 mg/dL (ref 0.0–149.0)
VLDL: 15.4 mg/dL (ref 0.0–40.0)

## 2015-10-18 LAB — TSH: TSH: 1.24 u[IU]/mL (ref 0.35–4.50)

## 2015-10-18 LAB — HEMOGLOBIN A1C: HEMOGLOBIN A1C: 6 % (ref 4.6–6.5)

## 2015-10-18 NOTE — Progress Notes (Signed)
Pre visit review using our clinic review tool, if applicable. No additional management support is needed unless otherwise documented below in the visit note. 

## 2015-10-18 NOTE — Patient Instructions (Signed)
Hypertension Hypertension, commonly called high blood pressure, is when the force of blood pumping through your arteries is too strong. Your arteries are the blood vessels that carry blood from your heart throughout your body. A blood pressure reading consists of a higher number over a lower number, such as 110/72. The higher number (systolic) is the pressure inside your arteries when your heart pumps. The lower number (diastolic) is the pressure inside your arteries when your heart relaxes. Ideally you want your blood pressure below 120/80. Hypertension forces your heart to work harder to pump blood. Your arteries may become narrow or stiff. Having untreated or uncontrolled hypertension can cause heart attack, stroke, kidney disease, and other problems. RISK FACTORS Some risk factors for high blood pressure are controllable. Others are not.  Risk factors you cannot control include:   Race. You may be at higher risk if you are African American.  Age. Risk increases with age.  Gender. Men are at higher risk than women before age 45 years. After age 65, women are at higher risk than men. Risk factors you can control include:  Not getting enough exercise or physical activity.  Being overweight.  Getting too much fat, sugar, calories, or salt in your diet.  Drinking too much alcohol. SIGNS AND SYMPTOMS Hypertension does not usually cause signs or symptoms. Extremely high blood pressure (hypertensive crisis) may cause headache, anxiety, shortness of breath, and nosebleed. DIAGNOSIS To check if you have hypertension, your health care provider will measure your blood pressure while you are seated, with your arm held at the level of your heart. It should be measured at least twice using the same arm. Certain conditions can cause a difference in blood pressure between your right and left arms. A blood pressure reading that is higher than normal on one occasion does not mean that you need treatment. If  it is not clear whether you have high blood pressure, you may be asked to return on a different day to have your blood pressure checked again. Or, you may be asked to monitor your blood pressure at home for 1 or more weeks. TREATMENT Treating high blood pressure includes making lifestyle changes and possibly taking medicine. Living a healthy lifestyle can help lower high blood pressure. You may need to change some of your habits. Lifestyle changes may include:  Following the DASH diet. This diet is high in fruits, vegetables, and whole grains. It is low in salt, red meat, and added sugars.  Keep your sodium intake below 2,300 mg per day.  Getting at least 30-45 minutes of aerobic exercise at least 4 times per week.  Losing weight if necessary.  Not smoking.  Limiting alcoholic beverages.  Learning ways to reduce stress. Your health care provider may prescribe medicine if lifestyle changes are not enough to get your blood pressure under control, and if one of the following is true:  You are 18-59 years of age and your systolic blood pressure is above 140.  You are 60 years of age or older, and your systolic blood pressure is above 150.  Your diastolic blood pressure is above 90.  You have diabetes, and your systolic blood pressure is over 140 or your diastolic blood pressure is over 90.  You have kidney disease and your blood pressure is above 140/90.  You have heart disease and your blood pressure is above 140/90. Your personal target blood pressure may vary depending on your medical conditions, your age, and other factors. HOME CARE INSTRUCTIONS    Have your blood pressure rechecked as directed by your health care provider.   Take medicines only as directed by your health care provider. Follow the directions carefully. Blood pressure medicines must be taken as prescribed. The medicine does not work as well when you skip doses. Skipping doses also puts you at risk for  problems.  Do not smoke.   Monitor your blood pressure at home as directed by your health care provider. SEEK MEDICAL CARE IF:   You think you are having a reaction to medicines taken.  You have recurrent headaches or feel dizzy.  You have swelling in your ankles.  You have trouble with your vision. SEEK IMMEDIATE MEDICAL CARE IF:  You develop a severe headache or confusion.  You have unusual weakness, numbness, or feel faint.  You have severe chest or abdominal pain.  You vomit repeatedly.  You have trouble breathing. MAKE SURE YOU:   Understand these instructions.  Will watch your condition.  Will get help right away if you are not doing well or get worse.   This information is not intended to replace advice given to you by your health care provider. Make sure you discuss any questions you have with your health care provider.   Document Released: 08/28/2005 Document Revised: 01/12/2015 Document Reviewed: 06/20/2013 Elsevier Interactive Patient Education 2016 [employer]  

## 2015-10-18 NOTE — Progress Notes (Signed)
Subjective:  Patient ID: Connor Johnson, male    DOB: Jan 24, 1941  Age: 75 y.o. MRN: [id number]  CC: Hypertension and Hyperlipidemia   HPI Connor Johnson presents for establishment as a new patient, he is status post a recent TIA and is doing quite well. He returns today for evaluation of his blood pressure and cholesterol level. He tells me that he has no residual deficits from the TIA. He was not willing to have a complete physical today.  Outpatient Prescriptions Prior to Visit  Medication Sig Dispense Refill  . amLODipine (NORVASC) 5 MG tablet Take 5 mg by mouth daily.    Marland Kitchen aspirin 81 MG tablet Take 81 mg by mouth daily.    . clopidogrel (PLAVIX) 75 MG tablet Take 75 mg by mouth daily.    . meloxicam (MOBIC) 15 MG tablet Take 7.5 mg by mouth daily.     . rosuvastatin (CRESTOR) 10 MG tablet Take 10 mg by mouth daily.     No facility-administered medications prior to visit.    ROS Review of Systems  Constitutional: Negative.  Negative for fever, chills, diaphoresis, appetite change and fatigue.  HENT: Negative.   Eyes: Negative.  Negative for photophobia and visual disturbance.  Respiratory: Negative.  Negative for cough, choking, chest tightness, shortness of breath and stridor.   Cardiovascular: Negative.  Negative for chest pain, palpitations and leg swelling.  Gastrointestinal: Negative.  Negative for nausea, vomiting, abdominal pain, diarrhea, constipation and blood in stool.  Endocrine: Negative.   Genitourinary: Negative.   Musculoskeletal: Positive for arthralgias. Negative for myalgias, back pain, joint swelling and neck pain.  Skin: Negative.  Negative for color change and rash.  Allergic/Immunologic: Negative.   Neurological: Negative.  Negative for dizziness, tremors, seizures, syncope, weakness, light-headedness, numbness and headaches.  Hematological: Negative.  Negative for adenopathy. Does not bruise/bleed easily.  Psychiatric/Behavioral: Negative.     Objective:  BP  120/88 mmHg  Pulse 80  Temp(Src) 98.1 F (36.7 C) (Oral)  Resp 16  Ht 5' 6.5" (1.689 m)  Wt 200 lb (90.719 kg)  BMI 31.80 kg/m2  SpO2 97%  BP Readings from Last 3 Encounters:  10/18/15 120/88  09/27/15 132/82  09/16/15 134/60    Wt Readings from Last 3 Encounters:  10/18/15 200 lb (90.719 kg)  09/27/15 203 lb (92.08 kg)  09/16/15 203 lb 4 oz (92.194 kg)    Physical Exam  Constitutional: He is oriented to person, place, and time. He appears well-developed and well-nourished. No distress.  HENT:  Head: Normocephalic and atraumatic.  Mouth/Throat: Oropharynx is clear and moist. No oropharyngeal exudate.  Eyes: Conjunctivae are normal. Right eye exhibits no discharge. Left eye exhibits no discharge. No scleral icterus.  Neck: Normal range of motion. Neck supple. No JVD present. No tracheal deviation present. No thyromegaly present.  Cardiovascular: Normal rate, regular rhythm, normal heart sounds and intact distal pulses.  Exam reveals no gallop and no friction rub.   No murmur heard. Pulmonary/Chest: Effort normal and breath sounds normal. No stridor. No respiratory distress. He has no wheezes. He has no rales. He exhibits no tenderness.  Abdominal: Soft. Bowel sounds are normal. He exhibits no distension and no mass. There is no tenderness. There is no rebound and no guarding.  Musculoskeletal: Normal range of motion. He exhibits no edema or tenderness.  Lymphadenopathy:    He has no cervical adenopathy.  Neurological: He is alert and oriented to person, place, and time. He has normal reflexes. He displays normal reflexes.  No cranial nerve deficit. He exhibits normal muscle tone. Coordination normal.  Skin: Skin is warm and dry. No rash noted. He is not diaphoretic. No erythema.  Vitals reviewed.   Lab Results  Component Value Date   WBC 8.3 09/01/2015   HGB 14.9 09/01/2015   HCT 44.3 09/01/2015   PLT 295 09/01/2015   GLUCOSE 100* 10/18/2015   CHOL 152 10/18/2015   TRIG  77.0 10/18/2015   HDL 69.80 10/18/2015   LDLCALC 66 10/18/2015   NA 139 10/18/2015   K 4.3 10/18/2015   CL 104 10/18/2015   CREATININE 0.90 10/18/2015   BUN 14 10/18/2015   CO2 27 10/18/2015   TSH 1.24 10/18/2015   HGBA1C 6.0 10/18/2015    Ct Abdomen Pelvis W Contrast  09/01/2015  CLINICAL DATA:  Left lower quadrant pain for 3 days EXAM: CT ABDOMEN AND PELVIS WITH CONTRAST TECHNIQUE: Multidetector CT imaging of the abdomen and pelvis was performed using the standard protocol following bolus administration of intravenous contrast. CONTRAST:  129mL OMNIPAQUE IOHEXOL 300 MG/ML SOLN, 21mL OMNIPAQUE IOHEXOL 300 MG/ML SOLN COMPARISON:  None. FINDINGS: The lung bases are free of acute infiltrate or sizable effusion. The liver, gallbladder, spleen, left adrenal gland and pancreas are within normal limits with the exception of a small hepatic cyst. A small nodule is noted in the right adrenal gland best seen on image number 25 of series 2 measuring less than 1 cm and likely representing a small adenoma. Kidneys are well visualized bilaterally. No renal calculi or obstructive changes are noted. No abnormal enhancement is seen. The appendix is within normal limits. Diverticular change of the colon is seen with evidence of diverticulitis in the sigmoid colon proximally. No micro perforation or abscess is identified at this time. The bladder is well distended. Changes of penile prosthesis are seen. No pelvic mass lesion is noted. Mild aortic calcifications are seen. Degenerative changes of the lumbar spine are noted. IMPRESSION: Diverticulitis within the sigmoid colon without evidence of abscess or perforation. Likely small right adrenal adenoma. Tiny cyst within the liver. Electronically Signed   By: Inez Catalina M.D.   On: 09/01/2015 13:44    Assessment & Plan:   Connor Johnson was seen today for hypertension and hyperlipidemia.  Diagnoses and all orders for this visit:  Hemispheric carotid artery syndrome-  improvement noted, will continue to modify his risk factors with blood pressure control, statin therapy, aspirin therapy. -     Lipid panel; Future  Essential hypertension- his blood pressure is well-controlled, electrolytes and renal function are stable, will continue amlodipine -     TSH; Future -     Basic metabolic panel; Future -     Urinalysis, Routine w reflex microscopic (not at [facility]); Future  Hyperglycemia- his A1c is 6%, he has prediabetes, no medications are needed at this time, he agrees to work on his lifestyle modifications. -     Basic metabolic panel; Future -     Hemoglobin A1c; Future  Hyperlipidemia with target LDL less than 70- he is achieved his LDL goal is doing well on the statin therapy, will continue rosuvastatin. -     Lipid panel; Future -     TSH; Future  I am having Connor Johnson maintain his amLODipine, clopidogrel, meloxicam, aspirin, and rosuvastatin.  Meds ordered this encounter  Medications  . rosuvastatin (CRESTOR) 20 MG tablet    Sig: Take 20 mg by mouth daily. Pt is taking .5 tablets     Follow-up: Return  in about 6 months (around 04/16/2016).  Scarlette Calico, MD

## 2015-10-19 ENCOUNTER — Encounter: Payer: Self-pay | Admitting: Internal Medicine

## 2015-10-20 ENCOUNTER — Telehealth: Payer: Self-pay | Admitting: Internal Medicine

## 2015-10-20 MED ORDER — AMLODIPINE BESYLATE 5 MG PO TABS: 5.0000 mg | ORAL_TABLET | Freq: Every day | ORAL | Status: DC

## 2015-10-20 MED ORDER — MELOXICAM 15 MG PO TABS: 7.5000 mg | ORAL_TABLET | Freq: Every day | ORAL | Status: DC

## 2015-10-20 NOTE — Telephone Encounter (Signed)
He needs the:  Amlodipine  Meloxicam  Rosovastatin   Refilled

## 2015-10-20 NOTE — Telephone Encounter (Signed)
Done

## 2015-10-20 NOTE — Telephone Encounter (Signed)
Pt wife called in said that pt was seen last week and refill was suppose to called in but was not.  Can you help pt with this?     [facility] [address] in [place]

## 2016-04-27 ENCOUNTER — Encounter: Payer: Self-pay | Admitting: Internal Medicine

## 2016-04-27 ENCOUNTER — Ambulatory Visit (INDEPENDENT_AMBULATORY_CARE_PROVIDER_SITE_OTHER): Payer: Medicare Other | Admitting: Internal Medicine

## 2016-04-27 VITALS — BP 150/90 | HR 97 | Temp 98.1°F | Resp 16 | Ht 66.5 in | Wt 208.5 lb

## 2016-04-27 DIAGNOSIS — G453 Amaurosis fugax: Secondary | ICD-10-CM | POA: Diagnosis not present

## 2016-04-27 DIAGNOSIS — E785 Hyperlipidemia, unspecified: Secondary | ICD-10-CM

## 2016-04-27 DIAGNOSIS — M159 Polyosteoarthritis, unspecified: Secondary | ICD-10-CM

## 2016-04-27 DIAGNOSIS — I10 Essential (primary) hypertension: Secondary | ICD-10-CM | POA: Diagnosis not present

## 2016-04-27 MED ORDER — ROSUVASTATIN CALCIUM 20 MG PO TABS: 20.0000 mg | ORAL_TABLET | Freq: Every day | ORAL | 3 refills | Status: DC

## 2016-04-27 MED ORDER — MELOXICAM 15 MG PO TABS: 7.5000 mg | ORAL_TABLET | Freq: Every day | ORAL | 1 refills | Status: DC

## 2016-04-27 MED ORDER — CLOPIDOGREL BISULFATE 75 MG PO TABS: 75.0000 mg | ORAL_TABLET | Freq: Every day | ORAL | 3 refills | Status: DC

## 2016-04-27 MED ORDER — TELMISARTAN 40 MG PO TABS: 40.0000 mg | ORAL_TABLET | Freq: Every day | ORAL | 3 refills | Status: DC

## 2016-04-27 NOTE — Patient Instructions (Signed)
Hypertension Hypertension, commonly called high blood pressure, is when the force of blood pumping through your arteries is too strong. Your arteries are the blood vessels that carry blood from your heart throughout your body. A blood pressure reading consists of a higher number over a lower number, such as 110/72. The higher number (systolic) is the pressure inside your arteries when your heart pumps. The lower number (diastolic) is the pressure inside your arteries when your heart relaxes. Ideally you want your blood pressure below 120/80. Hypertension forces your heart to work harder to pump blood. Your arteries may become narrow or stiff. Having untreated or uncontrolled hypertension can cause heart attack, stroke, kidney disease, and other problems. RISK FACTORS Some risk factors for high blood pressure are controllable. Others are not.  Risk factors you cannot control include:   Race. You may be at higher risk if you are African American.  Age. Risk increases with age.  Gender. Men are at higher risk than women before age 45 years. After age 65, women are at higher risk than men. Risk factors you can control include:  Not getting enough exercise or physical activity.  Being overweight.  Getting too much fat, sugar, calories, or salt in your diet.  Drinking too much alcohol. SIGNS AND SYMPTOMS Hypertension does not usually cause signs or symptoms. Extremely high blood pressure (hypertensive crisis) may cause headache, anxiety, shortness of breath, and nosebleed. DIAGNOSIS To check if you have hypertension, your health care provider will measure your blood pressure while you are seated, with your arm held at the level of your heart. It should be measured at least twice using the same arm. Certain conditions can cause a difference in blood pressure between your right and left arms. A blood pressure reading that is higher than normal on one occasion does not mean that you need treatment. If  it is not clear whether you have high blood pressure, you may be asked to return on a different day to have your blood pressure checked again. Or, you may be asked to monitor your blood pressure at home for 1 or more weeks. TREATMENT Treating high blood pressure includes making lifestyle changes and possibly taking medicine. Living a healthy lifestyle can help lower high blood pressure. You may need to change some of your habits. Lifestyle changes may include:  Following the DASH diet. This diet is high in fruits, vegetables, and whole grains. It is low in salt, red meat, and added sugars.  Keep your sodium intake below 2,300 mg per day.  Getting at least 30-45 minutes of aerobic exercise at least 4 times per week.  Losing weight if necessary.  Not smoking.  Limiting alcoholic beverages.  Learning ways to reduce stress. Your health care provider may prescribe medicine if lifestyle changes are not enough to get your blood pressure under control, and if one of the following is true:  You are 18-59 years of age and your systolic blood pressure is above 140.  You are 60 years of age or older, and your systolic blood pressure is above 150.  Your diastolic blood pressure is above 90.  You have diabetes, and your systolic blood pressure is over 140 or your diastolic blood pressure is over 90.  You have kidney disease and your blood pressure is above 140/90.  You have heart disease and your blood pressure is above 140/90. Your personal target blood pressure may vary depending on your medical conditions, your age, and other factors. HOME CARE INSTRUCTIONS    Have your blood pressure rechecked as directed by your health care provider.   Take medicines only as directed by your health care provider. Follow the directions carefully. Blood pressure medicines must be taken as prescribed. The medicine does not work as well when you skip doses. Skipping doses also puts you at risk for  problems.  Do not smoke.   Monitor your blood pressure at home as directed by your health care provider. SEEK MEDICAL CARE IF:   You think you are having a reaction to medicines taken.  You have recurrent headaches or feel dizzy.  You have swelling in your ankles.  You have trouble with your vision. SEEK IMMEDIATE MEDICAL CARE IF:  You develop a severe headache or confusion.  You have unusual weakness, numbness, or feel faint.  You have severe chest or abdominal pain.  You vomit repeatedly.  You have trouble breathing. MAKE SURE YOU:   Understand these instructions.  Will watch your condition.  Will get help right away if you are not doing well or get worse.   This information is not intended to replace advice given to you by your health care provider. Make sure you discuss any questions you have with your health care provider.   Document Released: 08/28/2005 Document Revised: 01/12/2015 Document Reviewed: 06/20/2013 Elsevier Interactive Patient Education 2016 [employer]  

## 2016-04-27 NOTE — Progress Notes (Signed)
Pre visit review using our clinic review tool, if applicable. No additional management support is needed unless otherwise documented below in the visit note. 

## 2016-04-27 NOTE — Progress Notes (Signed)
Subjective:  Patient ID: Connor Johnson, male    DOB: 1941-09-04  Age: 75 y.o. MRN: [id number]  CC: Hypertension and Osteoarthritis   HPI Brandol Brixey presents for follow-up on hypertension. He had a prior TIA about a year and a half ago that was treated by a neurologist in [facility]. He describes it as amaurosis fugax. He thinks his blood pressure has been well controlled at home with recent numbers of about 123/78. He exercises and denies any recent episodes of chest pain, DOE, shortness of breath, palpitations, edema, or fatigue.  He has chronic arthritis pain in both feet and wants to continue taking meloxicam.  Outpatient Medications Prior to Visit  Medication Sig Dispense Refill  . amLODipine (NORVASC) 5 MG tablet Take 1 tablet (5 mg total) by mouth daily. 90 tablet 1  . aspirin 81 MG tablet Take 81 mg by mouth daily.    . clopidogrel (PLAVIX) 75 MG tablet Take 75 mg by mouth daily.    . meloxicam (MOBIC) 15 MG tablet Take 0.5 tablets (7.5 mg total) by mouth daily. 90 tablet 1  . rosuvastatin (CRESTOR) 20 MG tablet Take 20 mg by mouth daily. Pt is taking .5 tablets     No facility-administered medications prior to visit.     ROS Review of Systems  Constitutional: Negative.  Negative for activity change, diaphoresis, fatigue and unexpected weight change.  HENT: Negative.   Eyes: Negative.  Negative for photophobia and visual disturbance.  Respiratory: Negative.  Negative for cough, choking, chest tightness, shortness of breath and stridor.   Cardiovascular: Negative.  Negative for chest pain, palpitations and leg swelling.  Gastrointestinal: Negative.  Negative for abdominal pain, blood in stool, constipation, diarrhea, nausea and vomiting.  Endocrine: Negative.   Genitourinary: Negative.   Musculoskeletal: Positive for arthralgias. Negative for back pain, myalgias and neck pain.  Skin: Negative.  Negative for rash.  Allergic/Immunologic: Negative.   Neurological: Negative.   Negative for dizziness, tremors, seizures, syncope, facial asymmetry, speech difficulty, weakness, light-headedness, numbness and headaches.  Hematological: Negative.  Negative for adenopathy. Does not bruise/bleed easily.  Psychiatric/Behavioral: Negative.     Objective:  BP (!) 150/90 (BP Location: Left Arm, Patient Position: Sitting, Cuff Size: Normal)   Pulse 97   Temp 98.1 F (36.7 C) (Oral)   Resp 16   Ht 5' 6.5" (1.689 m)   Wt 208 lb 8 oz (94.6 kg)   SpO2 95%   BMI 33.15 kg/m   BP Readings from Last 3 Encounters:  04/27/16 (!) 150/90  10/18/15 120/88  09/27/15 132/82    Wt Readings from Last 3 Encounters:  04/27/16 208 lb 8 oz (94.6 kg)  10/18/15 200 lb (90.7 kg)  09/27/15 203 lb (92.1 kg)    Physical Exam  Constitutional: He is oriented to person, place, and time. No distress.  HENT:  Head: Normocephalic and atraumatic.  Mouth/Throat: Oropharynx is clear and moist. No oropharyngeal exudate.  Eyes: Conjunctivae are normal. Right eye exhibits no discharge. Left eye exhibits no discharge. No scleral icterus.  Neck: Normal range of motion. Neck supple. No JVD present. No tracheal deviation present. No thyromegaly present.  Cardiovascular: Normal rate, regular rhythm, normal heart sounds and intact distal pulses.  Exam reveals no gallop and no friction rub.   No murmur heard. Pulmonary/Chest: Effort normal and breath sounds normal. No stridor. No respiratory distress. He has no wheezes. He has no rales. He exhibits no tenderness.  Abdominal: Soft. Bowel sounds are normal. He exhibits  no distension and no mass. There is no tenderness. There is no rebound and no guarding.  Musculoskeletal: Normal range of motion. He exhibits no edema, tenderness or deformity.  Lymphadenopathy:    He has no cervical adenopathy.  Neurological: He is alert and oriented to person, place, and time. He has normal reflexes. He displays normal reflexes. No cranial nerve deficit. He exhibits normal  muscle tone. Coordination normal.  Skin: Skin is warm and dry. No rash noted. He is not diaphoretic. No erythema. No pallor.  Vitals reviewed.   Lab Results  Component Value Date   WBC 8.3 09/01/2015   HGB 14.9 09/01/2015   HCT 44.3 09/01/2015   PLT 295 09/01/2015   GLUCOSE 100 (H) 10/18/2015   CHOL 152 10/18/2015   TRIG 77.0 10/18/2015   HDL 69.80 10/18/2015   LDLCALC 66 10/18/2015   NA 139 10/18/2015   K 4.3 10/18/2015   CL 104 10/18/2015   CREATININE 0.90 10/18/2015   BUN 14 10/18/2015   CO2 27 10/18/2015   TSH 1.24 10/18/2015   HGBA1C 6.0 10/18/2015    Ct Abdomen Pelvis W Contrast  Result Date: 09/01/2015 CLINICAL DATA:  Left lower quadrant pain for 3 days EXAM: CT ABDOMEN AND PELVIS WITH CONTRAST TECHNIQUE: Multidetector CT imaging of the abdomen and pelvis was performed using the standard protocol following bolus administration of intravenous contrast. CONTRAST:  118mL OMNIPAQUE IOHEXOL 300 MG/ML SOLN, 37mL OMNIPAQUE IOHEXOL 300 MG/ML SOLN COMPARISON:  None. FINDINGS: The lung bases are free of acute infiltrate or sizable effusion. The liver, gallbladder, spleen, left adrenal gland and pancreas are within normal limits with the exception of a small hepatic cyst. A small nodule is noted in the right adrenal gland best seen on image number 25 of series 2 measuring less than 1 cm and likely representing a small adenoma. Kidneys are well visualized bilaterally. No renal calculi or obstructive changes are noted. No abnormal enhancement is seen. The appendix is within normal limits. Diverticular change of the colon is seen with evidence of diverticulitis in the sigmoid colon proximally. No micro perforation or abscess is identified at this time. The bladder is well distended. Changes of penile prosthesis are seen. No pelvic mass lesion is noted. Mild aortic calcifications are seen. Degenerative changes of the lumbar spine are noted. IMPRESSION: Diverticulitis within the sigmoid colon  without evidence of abscess or perforation. Likely small right adrenal adenoma. Tiny cyst within the liver. Electronically Signed   By: Inez Catalina M.D.   On: 09/01/2015 13:44    Assessment & Plan:   Ringo was seen today for hypertension and osteoarthritis.  Diagnoses and all orders for this visit:  Essential hypertension - his blood pressure today is not adequately well controlled, he is not having any side effects from the CCB but I think for overall cardiovascular health it would be better for him to take an ARB. I therefore asked him to stop taking amlodipine and we'll switch to telmisartan. -     telmisartan (MICARDIS) 40 MG tablet; Take 1 tablet (40 mg total) by mouth daily.  Amaurosis fugax- will continue to treat his risk factors aggressively with blood pressure and LDL control, will also continue clopidogrel to reduce the risk of recurrent stroke. -     rosuvastatin (CRESTOR) 20 MG tablet; Take 1 tablet (20 mg total) by mouth daily. Pt is taking .5 tablets -     clopidogrel (PLAVIX) 75 MG tablet; Take 1 tablet (75 mg total) by mouth daily. -  telmisartan (MICARDIS) 40 MG tablet; Take 1 tablet (40 mg total) by mouth daily.  Hyperlipidemia with target LDL less than 70- he has achieved his LDL goal and is doing well on Crestor. -     rosuvastatin (CRESTOR) 20 MG tablet; Take 1 tablet (20 mg total) by mouth daily. Pt is taking .5 tablets  Generalized OA -     meloxicam (MOBIC) 15 MG tablet; Take 0.5 tablets (7.5 mg total) by mouth daily.  Other orders -     Cancel: amLODipine (NORVASC) 5 MG tablet; Take 1 tablet (5 mg total) by mouth daily.   I have discontinued Mr. Severs aspirin and amLODipine. I have also changed his rosuvastatin and clopidogrel. Additionally, I am having him start on telmisartan. Lastly, I am having him maintain his meloxicam.  Meds ordered this encounter  Medications  . meloxicam (MOBIC) 15 MG tablet    Sig: Take 0.5 tablets (7.5 mg total) by mouth  daily.    Dispense:  90 tablet    Refill:  1  . rosuvastatin (CRESTOR) 20 MG tablet    Sig: Take 1 tablet (20 mg total) by mouth daily. Pt is taking .5 tablets    Dispense:  90 tablet    Refill:  3  . clopidogrel (PLAVIX) 75 MG tablet    Sig: Take 1 tablet (75 mg total) by mouth daily.    Dispense:  90 tablet    Refill:  3  . telmisartan (MICARDIS) 40 MG tablet    Sig: Take 1 tablet (40 mg total) by mouth daily.    Dispense:  90 tablet    Refill:  3     Follow-up: Return in about 3 months (around 07/28/2016).  Scarlette Calico, MD

## 2016-05-16 IMAGING — CT CT ABD-PELV W/ CM
2 of 5 series · 17 of 46 positions shown, 19 images · IV contrast (omnipaque)
Comparison: None.

CLINICAL DATA: Left lower quadrant pain for 3 days

EXAM:
CT ABDOMEN AND PELVIS WITH CONTRAST
TECHNIQUE: Multidetector CT imaging of the abdomen and pelvis was performed
using the standard protocol following bolus administration of
intravenous contrast.
CONTRAST:  100mL OMNIPAQUE IOHEXOL 300 MG/ML SOLN, 25mL OMNIPAQUE
IOHEXOL 300 MG/ML SOLN

[Series 2: axial st · axial · 0.92mm/px · z∈[-522,-77]mm · 14 of 101 slices shown, 16 images]
[im 6/101  soft-tissue]
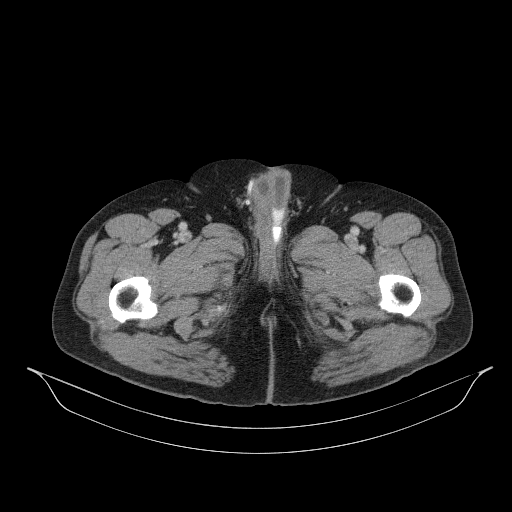
[im 6/101  bone]
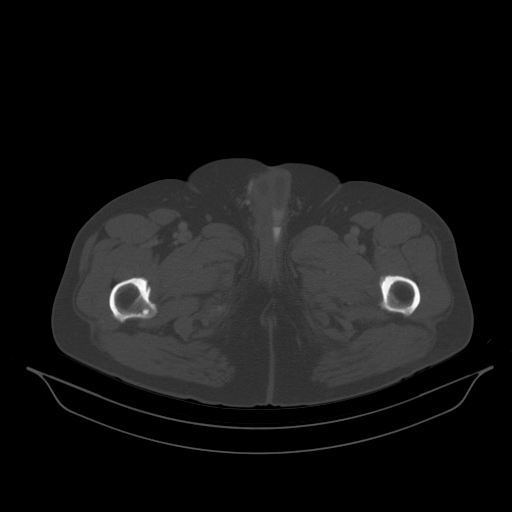
[im 11/101  soft-tissue]
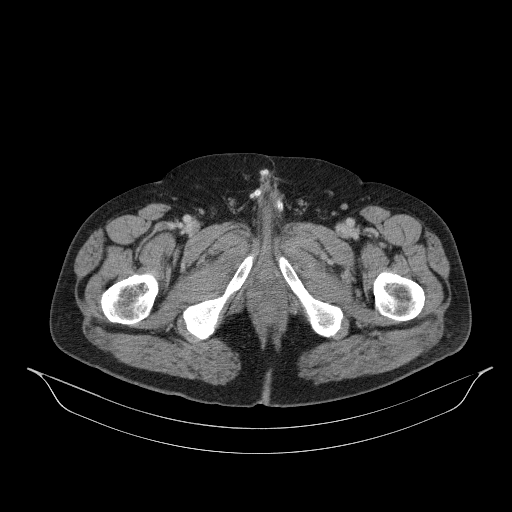
[im 22/101  soft-tissue]
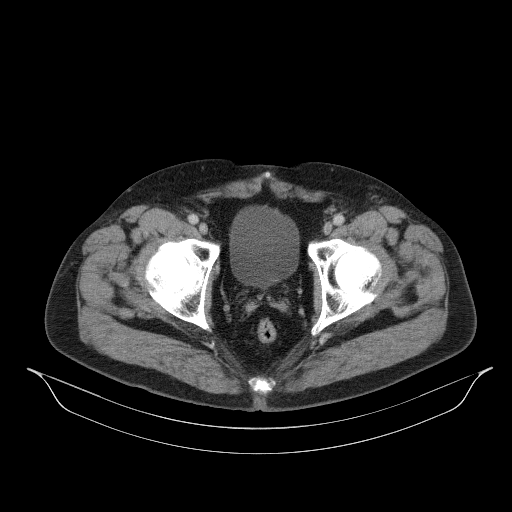
[im 27/101  soft-tissue]
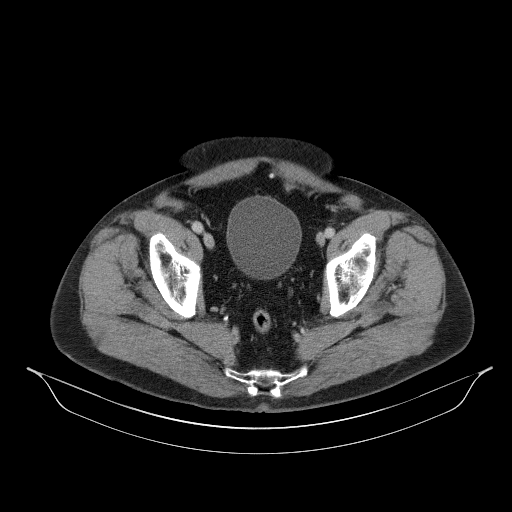
[im 32/101  soft-tissue]
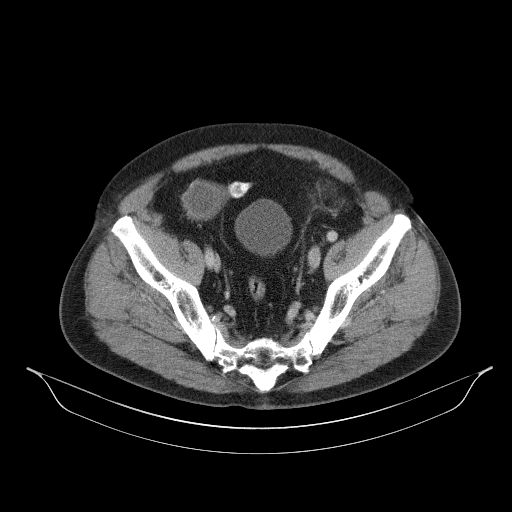
[im 43/101  soft-tissue]
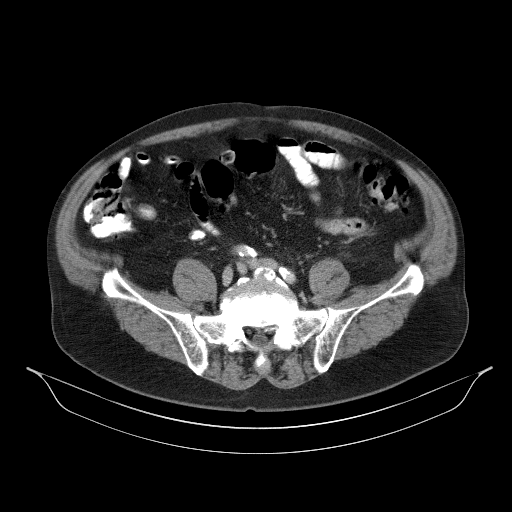
[im 48/101  soft-tissue]
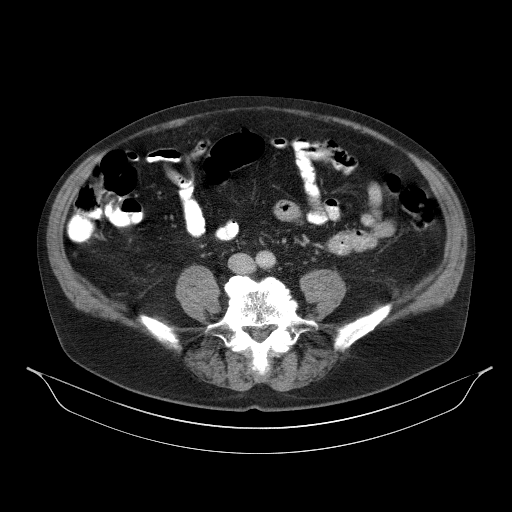
[im 53/101  soft-tissue]
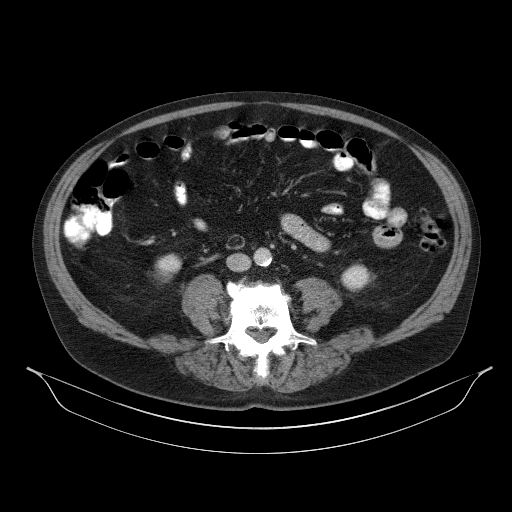
[im 58/101  soft-tissue]
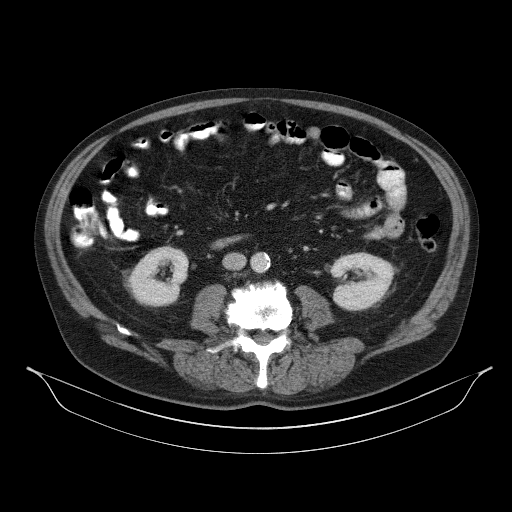
[im 58/101  bone]
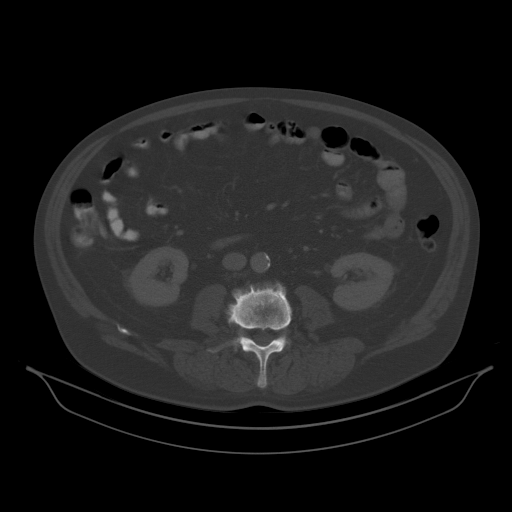
[im 69/101  soft-tissue]
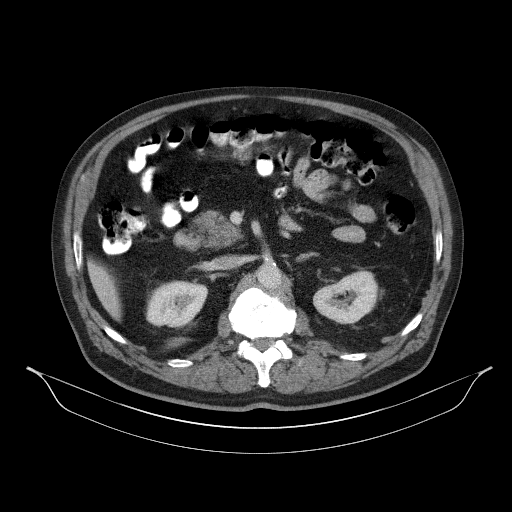
[im 74/101  soft-tissue]
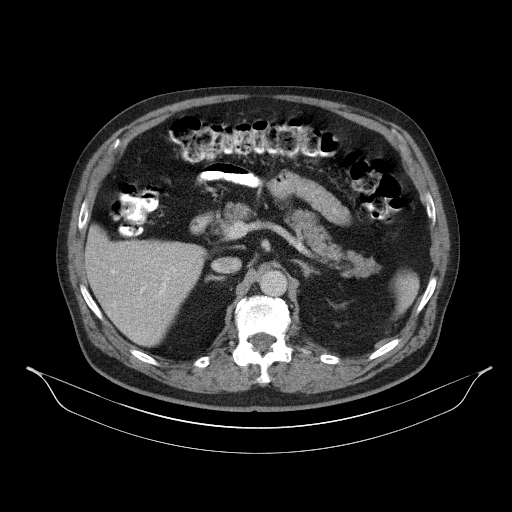
[im 79/101  soft-tissue]
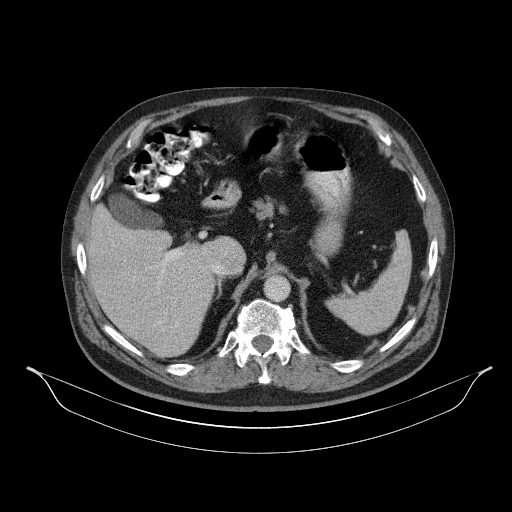
[im 90/101  soft-tissue]
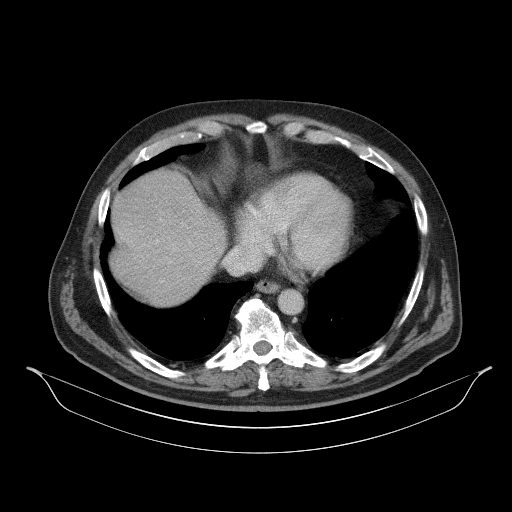
[im 95/101  soft-tissue]
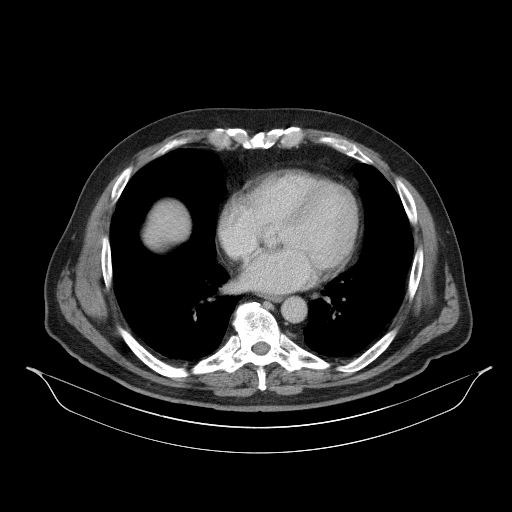

[Series 603: coronal · coronal · 0.98mm/px · 3 of 150 slices shown]
[im 50/150  soft-tissue]
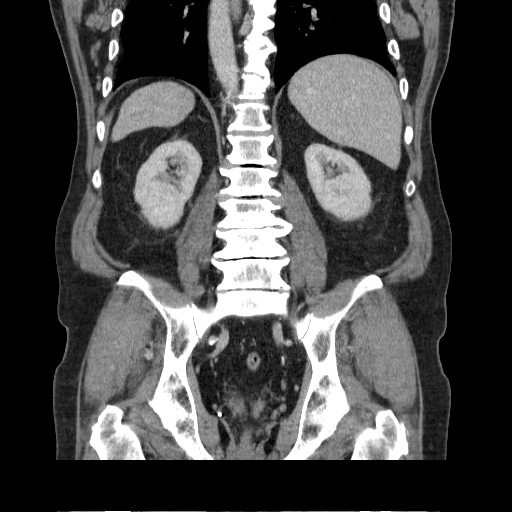
[im 67/150  soft-tissue]
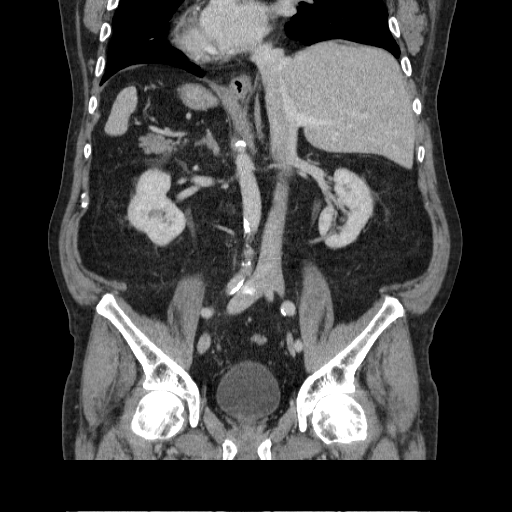
[im 83/150  soft-tissue]
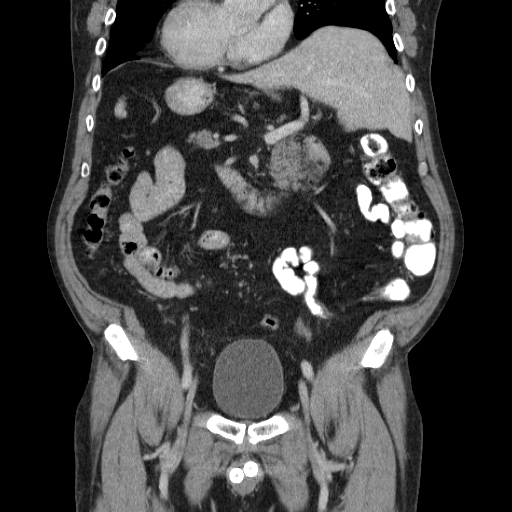

[17 of 46 positions shown; findings below may reference images not displayed]

FINDINGS: The lung bases are free of acute infiltrate or sizable effusion.

The liver, gallbladder, spleen, left adrenal gland and pancreas are
within normal limits with the exception of a small hepatic cyst. A
small nodule is noted in the right adrenal gland best seen on image
number 25 of series 2 measuring less than 1 cm and likely
representing a small adenoma.

Kidneys are well visualized bilaterally. No renal calculi or
obstructive changes are noted. No abnormal enhancement is seen.

The appendix is within normal limits. Diverticular change of the
colon is seen with evidence of diverticulitis in the sigmoid colon
proximally. No micro perforation or abscess is identified at this
time. The bladder is well distended. Changes of penile prosthesis
are seen. No pelvic mass lesion is noted. Mild aortic calcifications
are seen. Degenerative changes of the lumbar spine are noted.
IMPRESSION: Diverticulitis within the sigmoid colon without evidence of abscess
or perforation.

Likely small right adrenal adenoma.

Tiny cyst within the liver.

## 2016-07-19 ENCOUNTER — Encounter: Payer: Self-pay | Admitting: Internal Medicine

## 2016-07-19 ENCOUNTER — Ambulatory Visit (INDEPENDENT_AMBULATORY_CARE_PROVIDER_SITE_OTHER): Payer: Medicare Other | Admitting: Internal Medicine

## 2016-07-19 ENCOUNTER — Other Ambulatory Visit (INDEPENDENT_AMBULATORY_CARE_PROVIDER_SITE_OTHER): Payer: Medicare Other

## 2016-07-19 VITALS — BP 130/78 | HR 101 | Temp 98.2°F | Resp 16 | Ht 66.5 in | Wt 201.8 lb

## 2016-07-19 DIAGNOSIS — Z0000 Encounter for general adult medical examination without abnormal findings: Secondary | ICD-10-CM | POA: Diagnosis not present

## 2016-07-19 DIAGNOSIS — G459 Transient cerebral ischemic attack, unspecified: Secondary | ICD-10-CM | POA: Diagnosis not present

## 2016-07-19 DIAGNOSIS — Z23 Encounter for immunization: Secondary | ICD-10-CM | POA: Diagnosis not present

## 2016-07-19 DIAGNOSIS — E785 Hyperlipidemia, unspecified: Secondary | ICD-10-CM

## 2016-07-19 DIAGNOSIS — I10 Essential (primary) hypertension: Secondary | ICD-10-CM | POA: Diagnosis not present

## 2016-07-19 DIAGNOSIS — R739 Hyperglycemia, unspecified: Secondary | ICD-10-CM | POA: Diagnosis not present

## 2016-07-19 DIAGNOSIS — N400 Enlarged prostate without lower urinary tract symptoms: Secondary | ICD-10-CM | POA: Diagnosis not present

## 2016-07-19 LAB — PSA: PSA: 2.34 ng/mL (ref 0.10–4.00)

## 2016-07-19 LAB — CBC WITH DIFFERENTIAL/PLATELET
BASOS PCT: 0.2 % (ref 0.0–3.0)
Basophils Absolute: 0 10*3/uL (ref 0.0–0.1)
EOS PCT: 2.6 % (ref 0.0–5.0)
Eosinophils Absolute: 0.2 10*3/uL (ref 0.0–0.7)
HEMATOCRIT: 48 % (ref 39.0–52.0)
HEMOGLOBIN: 16.3 g/dL (ref 13.0–17.0)
LYMPHS PCT: 15.3 % (ref 12.0–46.0)
Lymphs Abs: 1.2 10*3/uL (ref 0.7–4.0)
MCHC: 33.9 g/dL (ref 30.0–36.0)
MCV: 95.1 fl (ref 78.0–100.0)
MONO ABS: 1 10*3/uL (ref 0.1–1.0)
Monocytes Relative: 12.7 % — ABNORMAL HIGH (ref 3.0–12.0)
Neutro Abs: 5.4 10*3/uL (ref 1.4–7.7)
Neutrophils Relative %: 69.2 % (ref 43.0–77.0)
Platelets: 243 10*3/uL (ref 150.0–400.0)
RBC: 5.05 Mil/uL (ref 4.22–5.81)
RDW: 13 % (ref 11.5–15.5)
WBC: 7.8 10*3/uL (ref 4.0–10.5)

## 2016-07-19 LAB — COMPREHENSIVE METABOLIC PANEL
ALT: 15 U/L (ref 0–53)
AST: 16 U/L (ref 0–37)
Albumin: 4.2 g/dL (ref 3.5–5.2)
Alkaline Phosphatase: 45 U/L (ref 39–117)
BUN: 15 mg/dL (ref 6–23)
CALCIUM: 9.2 mg/dL (ref 8.4–10.5)
CHLORIDE: 103 meq/L (ref 96–112)
CO2: 29 meq/L (ref 19–32)
Creatinine, Ser: 1.01 mg/dL (ref 0.40–1.50)
GFR: 76.85 mL/min (ref >60.00)
GLUCOSE: 112 mg/dL — AB (ref 70–99)
POTASSIUM: 4.2 meq/L (ref 3.5–5.1)
Sodium: 139 mEq/L (ref 135–145)
Total Bilirubin: 1 mg/dL (ref 0.2–1.2)
Total Protein: 7 g/dL (ref 6.0–8.3)

## 2016-07-19 LAB — URINALYSIS, ROUTINE W REFLEX MICROSCOPIC
BILIRUBIN URINE: NEGATIVE
Hgb urine dipstick: NEGATIVE
KETONES UR: NEGATIVE
LEUKOCYTES UA: NEGATIVE
Nitrite: NEGATIVE
PH: 5.5 (ref 5.0–8.0)
RBC / HPF: NONE SEEN (ref 0–?)
SPECIFIC GRAVITY, URINE: 1.02 (ref 1.000–1.030)
TOTAL PROTEIN, URINE-UPE24: NEGATIVE
URINE GLUCOSE: NEGATIVE
UROBILINOGEN UA: 0.2 (ref 0.0–1.0)

## 2016-07-19 LAB — LIPID PANEL
CHOL/HDL RATIO: 2
Cholesterol: 145 mg/dL (ref 0–200)
HDL: 65.7 mg/dL (ref >39.00)
LDL CALC: 60 mg/dL (ref 0–99)
NONHDL: 79.46
TRIGLYCERIDES: 99 mg/dL (ref 0.0–149.0)
VLDL: 19.8 mg/dL (ref 0.0–40.0)

## 2016-07-19 LAB — TSH: TSH: 1.81 u[IU]/mL (ref 0.35–4.50)

## 2016-07-19 LAB — HEMOGLOBIN A1C: Hgb A1c MFr Bld: 6 % (ref 4.6–6.5)

## 2016-07-19 MED ORDER — ASPIRIN EC 81 MG PO TBEC: 81.0000 mg | DELAYED_RELEASE_TABLET | Freq: Every day | ORAL | 3 refills | Status: DC

## 2016-07-19 MED ORDER — ZOSTER VACCINE LIVE 19400 UNT/0.65ML ~~LOC~~ SUSR: 0.6500 mL | Freq: Once | SUBCUTANEOUS | 0 refills | Status: AC

## 2016-07-19 NOTE — Patient Instructions (Signed)

## 2016-07-19 NOTE — Progress Notes (Signed)
Pre visit review using our clinic review tool, if applicable. No additional management support is needed unless otherwise documented below in the visit note. 

## 2016-07-19 NOTE — Progress Notes (Signed)
Subjective:  Patient ID: Connor Johnson, male    DOB: August 07, 1941  Age: 75 y.o. MRN: [id number]  CC: Annual Exam; Hypertension; and Hyperlipidemia   HPI Jaxiel Kotalik presents for an AWV/CPX.  He reports that his blood pressure has been well controlled on the ARB. He has had no recent episodes of headache/blurred vision/chest pain/shortness of breath/palpitations/edema/fatigue.  He is doing well on the statin and denies any recent episodes of muscle or joint aches.   Past Medical History:  Diagnosis Date  . Arthritis   . Cataract   . COPD (chronic obstructive pulmonary disease) ([place])   . Diverticulitis   . Diverticulosis   . HLD (hyperlipidemia)   . Hypertension   . TIA (transient ischemic attack)    Past Surgical History:  Procedure Laterality Date  . PENILE PROSTHESIS IMPLANT      reports that he quit smoking about 20 years ago. His smoking use included Cigarettes. He has never used smokeless tobacco. He reports that he drinks alcohol. He reports that he does not use drugs. family history includes Diabetes in his brother; Heart disease in his paternal grandmother; Liver cancer in his father; Prostate cancer in his brother. Allergies  Allergen Reactions  . Morphine And Related Other (See Comments)    Tremors and delirious  . Hctz [Hydrochlorothiazide] Rash    Outpatient Medications Prior to Visit  Medication Sig Dispense Refill  . clopidogrel (PLAVIX) 75 MG tablet Take 1 tablet (75 mg total) by mouth daily. 90 tablet 3  . meloxicam (MOBIC) 15 MG tablet Take 0.5 tablets (7.5 mg total) by mouth daily. 90 tablet 1  . rosuvastatin (CRESTOR) 20 MG tablet Take 1 tablet (20 mg total) by mouth daily. Pt is taking .5 tablets 90 tablet 3  . telmisartan (MICARDIS) 40 MG tablet Take 1 tablet (40 mg total) by mouth daily. 90 tablet 3   No facility-administered medications prior to visit.     ROS Review of Systems  Constitutional: Negative for activity change, appetite change,  diaphoresis and fatigue.  HENT: Negative.   Eyes: Negative.  Negative for visual disturbance.  Respiratory: Negative for cough, choking, chest tightness, shortness of breath and stridor.   Cardiovascular: Negative for chest pain, palpitations and leg swelling.  Gastrointestinal: Negative for abdominal pain, blood in stool, constipation, diarrhea, nausea and vomiting.  Endocrine: Negative.   Genitourinary: Negative.  Negative for difficulty urinating, dysuria, flank pain, frequency, hematuria, penile pain, penile swelling, scrotal swelling and testicular pain.  Musculoskeletal: Negative.  Negative for arthralgias, back pain, myalgias and neck pain.  Skin: Negative.  Negative for color change and rash.  Allergic/Immunologic: Negative.   Neurological: Negative.   Hematological: Negative.  Negative for adenopathy. Does not bruise/bleed easily.  Psychiatric/Behavioral: Negative.     Objective:  BP 130/78 (BP Location: Left Arm, Patient Position: Sitting, Cuff Size: Large)   Pulse (!) 101   Temp 98.2 F (36.8 C) (Oral)   Resp 16   Ht 5' 6.5" (1.689 m)   Wt 201 lb 12 oz (91.5 kg)   SpO2 94%   BMI 32.08 kg/m   BP Readings from Last 3 Encounters:  07/19/16 130/78  04/27/16 (!) 150/90  10/18/15 120/88    Wt Readings from Last 3 Encounters:  07/19/16 201 lb 12 oz (91.5 kg)  04/27/16 208 lb 8 oz (94.6 kg)  10/18/15 200 lb (90.7 kg)    Physical Exam  Constitutional: He is oriented to person, place, and time. He appears well-developed and well-nourished. No  distress.  HENT:  Mouth/Throat: Oropharynx is clear and moist. No oropharyngeal exudate.  Eyes: Conjunctivae are normal. Right eye exhibits no discharge. Left eye exhibits no discharge. No scleral icterus.  Neck: Normal range of motion. Neck supple. No JVD present. No tracheal deviation present. No thyromegaly present.  Cardiovascular: Normal rate, regular rhythm, normal heart sounds and intact distal pulses.  Exam reveals no  gallop and no friction rub.   No murmur heard. Pulses:      Carotid pulses are 1+ on the right side, and 1+ on the left side.      Radial pulses are 1+ on the right side, and 1+ on the left side.       Femoral pulses are 1+ on the right side, and 1+ on the left side.      Popliteal pulses are 1+ on the right side, and 1+ on the left side.       Dorsalis pedis pulses are 1+ on the right side, and 1+ on the left side.       Posterior tibial pulses are 1+ on the right side, and 1+ on the left side.  EKG ----  Sinus  Rhythm  Low voltage in limb leads.   ABNORMAL    Pulmonary/Chest: Effort normal and breath sounds normal. No stridor. No respiratory distress. He has no wheezes. He has no rales. He exhibits no tenderness.  Abdominal: Soft. Bowel sounds are normal. He exhibits no distension and no mass. There is no tenderness. There is no rebound and no guarding. Hernia confirmed negative in the right inguinal area and confirmed negative in the left inguinal area.  Genitourinary: Rectum normal, testes normal and penis normal. Rectal exam shows no external hemorrhoid, no internal hemorrhoid, no fissure, no mass, no tenderness, anal tone normal and guaiac negative stool. Prostate is enlarged (1+ smooth symm BPH). Prostate is not tender. Right testis shows no mass, no swelling and no tenderness. Right testis is descended. Left testis shows no swelling and no tenderness. Left testis is descended. Circumcised. No penile erythema or penile tenderness. No discharge found.  Musculoskeletal: Normal range of motion. He exhibits no edema, tenderness or deformity.  Lymphadenopathy:    He has no cervical adenopathy.       Right: No inguinal adenopathy present.       Left: No inguinal adenopathy present.  Neurological: He is oriented to person, place, and time.  Skin: Skin is warm and dry. No rash noted. He is not diaphoretic. No erythema. No pallor.  Psychiatric: He has a normal mood and affect. His behavior is  normal. Judgment and thought content normal.  Vitals reviewed.   Lab Results  Component Value Date   WBC 7.8 07/19/2016   HGB 16.3 07/19/2016   HCT 48.0 07/19/2016   PLT 243.0 07/19/2016   GLUCOSE 112 (H) 07/19/2016   CHOL 145 07/19/2016   TRIG 99.0 07/19/2016   HDL 65.70 07/19/2016   LDLCALC 60 07/19/2016   ALT 15 07/19/2016   AST 16 07/19/2016   NA 139 07/19/2016   K 4.2 07/19/2016   CL 103 07/19/2016   CREATININE 1.01 07/19/2016   BUN 15 07/19/2016   CO2 29 07/19/2016   TSH 1.81 07/19/2016   PSA 2.34 07/19/2016   HGBA1C 6.0 07/19/2016    Ct Abdomen Pelvis W Contrast  Result Date: 09/01/2015 CLINICAL DATA:  Left lower quadrant pain for 3 days EXAM: CT ABDOMEN AND PELVIS WITH CONTRAST TECHNIQUE: Multidetector CT imaging of the abdomen and  pelvis was performed using the standard protocol following bolus administration of intravenous contrast. CONTRAST:  134mL OMNIPAQUE IOHEXOL 300 MG/ML SOLN, 86mL OMNIPAQUE IOHEXOL 300 MG/ML SOLN COMPARISON:  None. FINDINGS: The lung bases are free of acute infiltrate or sizable effusion. The liver, gallbladder, spleen, left adrenal gland and pancreas are within normal limits with the exception of a small hepatic cyst. A small nodule is noted in the right adrenal gland best seen on image number 25 of series 2 measuring less than 1 cm and likely representing a small adenoma. Kidneys are well visualized bilaterally. No renal calculi or obstructive changes are noted. No abnormal enhancement is seen. The appendix is within normal limits. Diverticular change of the colon is seen with evidence of diverticulitis in the sigmoid colon proximally. No micro perforation or abscess is identified at this time. The bladder is well distended. Changes of penile prosthesis are seen. No pelvic mass lesion is noted. Mild aortic calcifications are seen. Degenerative changes of the lumbar spine are noted. IMPRESSION: Diverticulitis within the sigmoid colon without evidence  of abscess or perforation. Likely small right adrenal adenoma. Tiny cyst within the liver. Electronically Signed   By: Inez Catalina M.D.   On: 09/01/2015 13:44    Assessment & Plan:   Luiz was seen today for annual exam, hypertension and hyperlipidemia.  Diagnoses and all orders for this visit:  Essential hypertension- his blood pressure is well controlled, electrolytes and renal function are stable. -     Comprehensive metabolic panel; Future -     CBC with Differential/Platelet; Future -     Urinalysis, Routine w reflex microscopic (not at [facility]); Future -     EKG 12-Lead  Hyperglycemia- his A1c is at 6.0%. He is prediabetic. No medications are needed at this time. -     Comprehensive metabolic panel; Future -     Hemoglobin A1c; Future  Hyperlipidemia with target LDL less than 70- he has achieved his LDL goal and is doing well on the statin. -     Lipid panel; Future -     TSH; Future  Need for prophylactic vaccination and inoculation against influenza -     Flu vaccine HIGH DOSE PF (Fluzone High dose)  Need for prophylactic vaccination against Streptococcus pneumoniae (pneumococcus) -     Pneumococcal conjugate vaccine 13-valent  Routine general medical examination at a health care facility -     Zoster Vaccine Live, PF, (ZOSTAVAX) 91478 UNT/0.65ML injection; Inject 19,400 Units into the skin once.  Transient cerebral ischemia, unspecified type - he has no sequela related to this, it sounds like it was an episode of amaurosis fugax in his left eye, will continue risk factor modification with statin therapy, antiplatelet therapy, and blood pressure control. -     aspirin EC 81 MG tablet; Take 1 tablet (81 mg total) by mouth daily.  Benign prostatic hyperplasia without lower urinary tract symptoms- his PSA has not increased I'm not worried about prostate cancer, he has no symptoms that need to be treated. -     PSA; Future   I am having Mr. Orebaugh start on aspirin EC and Zoster  Vaccine Live (PF). I am also having him maintain his meloxicam, rosuvastatin, clopidogrel, and telmisartan.  Meds ordered this encounter  Medications  . aspirin EC 81 MG tablet    Sig: Take 1 tablet (81 mg total) by mouth daily.    Dispense:  90 tablet    Refill:  3  . Zoster Vaccine  Live, PF, (ZOSTAVAX) 91478 UNT/0.65ML injection    Sig: Inject 19,400 Units into the skin once.    Dispense:  1 each    Refill:  0   See AVS for instructions about healthy living and anticipatory guidance.  Follow-up: Return in about 6 months (around 01/16/2017).  Scarlette Calico, MD

## 2016-07-24 NOTE — Assessment & Plan Note (Signed)

## 2016-09-26 ENCOUNTER — Other Ambulatory Visit: Payer: Self-pay | Admitting: *Deleted

## 2016-09-26 DIAGNOSIS — H524 Presbyopia: Secondary | ICD-10-CM | POA: Diagnosis not present

## 2016-09-26 DIAGNOSIS — G453 Amaurosis fugax: Secondary | ICD-10-CM

## 2016-09-26 DIAGNOSIS — H5203 Hypermetropia, bilateral: Secondary | ICD-10-CM | POA: Diagnosis not present

## 2016-09-26 DIAGNOSIS — H02831 Dermatochalasis of right upper eyelid: Secondary | ICD-10-CM | POA: Diagnosis not present

## 2016-09-26 DIAGNOSIS — H43393 Other vitreous opacities, bilateral: Secondary | ICD-10-CM | POA: Diagnosis not present

## 2016-09-26 DIAGNOSIS — H02834 Dermatochalasis of left upper eyelid: Secondary | ICD-10-CM | POA: Diagnosis not present

## 2016-09-26 DIAGNOSIS — H2513 Age-related nuclear cataract, bilateral: Secondary | ICD-10-CM | POA: Diagnosis not present

## 2016-09-26 MED ORDER — CLOPIDOGREL BISULFATE 75 MG PO TABS: 75.0000 mg | ORAL_TABLET | Freq: Every day | ORAL | 1 refills | Status: DC

## 2016-09-26 NOTE — Addendum Note (Signed)
Addended by: Earnstine Regal on: 09/26/2016 12:20 PM   Modules accepted: Orders

## 2017-01-30 ENCOUNTER — Encounter: Payer: Self-pay | Admitting: Internal Medicine

## 2017-01-30 ENCOUNTER — Other Ambulatory Visit (INDEPENDENT_AMBULATORY_CARE_PROVIDER_SITE_OTHER): Payer: Medicare Other

## 2017-01-30 ENCOUNTER — Ambulatory Visit (INDEPENDENT_AMBULATORY_CARE_PROVIDER_SITE_OTHER): Payer: Medicare Other | Admitting: Internal Medicine

## 2017-01-30 VITALS — BP 130/88 | HR 86 | Temp 98.0°F | Resp 16 | Ht 66.5 in | Wt 208.0 lb

## 2017-01-30 DIAGNOSIS — I10 Essential (primary) hypertension: Secondary | ICD-10-CM

## 2017-01-30 DIAGNOSIS — R739 Hyperglycemia, unspecified: Secondary | ICD-10-CM

## 2017-01-30 LAB — BASIC METABOLIC PANEL
BUN: 15 mg/dL (ref 6–23)
CALCIUM: 9.3 mg/dL (ref 8.4–10.5)
CO2: 30 meq/L (ref 19–32)
CREATININE: 1.02 mg/dL (ref 0.40–1.50)
Chloride: 104 mEq/L (ref 96–112)
GFR: 75.87 mL/min (ref >60.00)
GLUCOSE: 118 mg/dL — AB (ref 70–99)
Potassium: 5.1 mEq/L (ref 3.5–5.1)
SODIUM: 140 meq/L (ref 135–145)

## 2017-01-30 LAB — HEMOGLOBIN A1C: Hgb A1c MFr Bld: 6.2 % (ref 4.6–6.5)

## 2017-01-30 NOTE — Progress Notes (Signed)
Subjective:  Patient ID: Connor Johnson, male    DOB: [birth year]  Age: 76 y.o. MRN: [id number]  CC: Hypertension   HPI Rudolfo Brandow presents for f/up - he feels well and offers no complaints. He has noticed that he has gained weight since his last visit and is having trouble controlling his caloric intake and increasing his activity level.  Outpatient Medications Prior to Visit  Medication Sig Dispense Refill  . aspirin EC 81 MG tablet Take 1 tablet (81 mg total) by mouth daily. 90 tablet 3  . clopidogrel (PLAVIX) 75 MG tablet Take 1 tablet (75 mg total) by mouth daily. 90 tablet 1  . meloxicam (MOBIC) 15 MG tablet Take 0.5 tablets (7.5 mg total) by mouth daily. 90 tablet 1  . rosuvastatin (CRESTOR) 20 MG tablet Take 1 tablet (20 mg total) by mouth daily. Pt is taking .5 tablets 90 tablet 3  . telmisartan (MICARDIS) 40 MG tablet Take 1 tablet (40 mg total) by mouth daily. 90 tablet 3   No facility-administered medications prior to visit.     ROS Review of Systems  Constitutional: Positive for unexpected weight change. Negative for appetite change, diaphoresis and fatigue.  HENT: Negative.  Negative for trouble swallowing.   Eyes: Negative for visual disturbance.  Respiratory: Negative for cough, chest tightness, shortness of breath and wheezing.   Cardiovascular: Negative for chest pain, palpitations and leg swelling.  Gastrointestinal: Negative for abdominal pain, constipation, diarrhea, nausea and vomiting.  Endocrine: Negative.   Genitourinary: Negative.  Negative for difficulty urinating.  Musculoskeletal: Negative for back pain and neck pain.  Skin: Negative.   Neurological: Negative.  Negative for dizziness, weakness and light-headedness.  Hematological: Negative for adenopathy. Does not bruise/bleed easily.  Psychiatric/Behavioral: Negative.     Objective:  BP 130/88 (BP Location: Left Arm, Patient Position: Sitting, Cuff Size: Normal)   Pulse 86   Temp 98 F (36.7 C)  (Oral)   Resp 16   Ht 5' 6.5" (1.689 m)   Wt 208 lb (94.3 kg)   SpO2 95%   BMI 33.07 kg/m   BP Readings from Last 3 Encounters:  01/30/17 130/88  07/19/16 130/78  04/27/16 (!) 150/90    Wt Readings from Last 3 Encounters:  01/30/17 208 lb (94.3 kg)  07/19/16 201 lb 12 oz (91.5 kg)  04/27/16 208 lb 8 oz (94.6 kg)    Physical Exam  Constitutional: He is oriented to person, place, and time. No distress.  HENT:  Mouth/Throat: Oropharynx is clear and moist. No oropharyngeal exudate.  Eyes: Conjunctivae are normal. Right eye exhibits no discharge. Left eye exhibits no discharge. No scleral icterus.  Neck: Normal range of motion. Neck supple. No JVD present. No tracheal deviation present. No thyromegaly present.  Cardiovascular: Normal rate, regular rhythm, normal heart sounds and intact distal pulses.  Exam reveals no gallop and no friction rub.   No murmur heard. Pulmonary/Chest: Effort normal and breath sounds normal. No stridor. No respiratory distress. He has no wheezes. He has no rales. He exhibits no tenderness.  Abdominal: Soft. Bowel sounds are normal. He exhibits no distension and no mass. There is no tenderness. There is no rebound and no guarding.  Musculoskeletal: Normal range of motion. He exhibits no edema or tenderness.  Lymphadenopathy:    He has no cervical adenopathy.  Neurological: He is oriented to person, place, and time. He has normal reflexes.  Skin: Skin is warm and dry. No rash noted. He is not diaphoretic. No erythema.  No pallor.  Vitals reviewed.   Lab Results  Component Value Date   WBC 7.8 07/19/2016   HGB 16.3 07/19/2016   HCT 48.0 07/19/2016   PLT 243.0 07/19/2016   GLUCOSE 118 (H) 01/30/2017   CHOL 145 07/19/2016   TRIG 99.0 07/19/2016   HDL 65.70 07/19/2016   LDLCALC 60 07/19/2016   ALT 15 07/19/2016   AST 16 07/19/2016   NA 140 01/30/2017   K 5.1 01/30/2017   CL 104 01/30/2017   CREATININE 1.02 01/30/2017   BUN 15 01/30/2017   CO2 30  01/30/2017   TSH 1.81 07/19/2016   PSA 2.34 07/19/2016   HGBA1C 6.2 01/30/2017    Ct Abdomen Pelvis W Contrast  Result Date: 09/01/2015 CLINICAL DATA:  Left lower quadrant pain for 3 days EXAM: CT ABDOMEN AND PELVIS WITH CONTRAST TECHNIQUE: Multidetector CT imaging of the abdomen and pelvis was performed using the standard protocol following bolus administration of intravenous contrast. CONTRAST:  134mL OMNIPAQUE IOHEXOL 300 MG/ML SOLN, 69mL OMNIPAQUE IOHEXOL 300 MG/ML SOLN COMPARISON:  None. FINDINGS: The lung bases are free of acute infiltrate or sizable effusion. The liver, gallbladder, spleen, left adrenal gland and pancreas are within normal limits with the exception of a small hepatic cyst. A small nodule is noted in the right adrenal gland best seen on image number 25 of series 2 measuring less than 1 cm and likely representing a small adenoma. Kidneys are well visualized bilaterally. No renal calculi or obstructive changes are noted. No abnormal enhancement is seen. The appendix is within normal limits. Diverticular change of the colon is seen with evidence of diverticulitis in the sigmoid colon proximally. No micro perforation or abscess is identified at this time. The bladder is well distended. Changes of penile prosthesis are seen. No pelvic mass lesion is noted. Mild aortic calcifications are seen. Degenerative changes of the lumbar spine are noted. IMPRESSION: Diverticulitis within the sigmoid colon without evidence of abscess or perforation. Likely small right adrenal adenoma. Tiny cyst within the liver. Electronically Signed   By: Inez Catalina M.D.   On: 09/01/2015 13:44    Assessment & Plan:   Macallister was seen today for hypertension.  Diagnoses and all orders for this visit:  Essential hypertension- his blood pressure is well-controlled, electrolytes and renal function are normal. We'll continue the ARB. -     Basic metabolic panel; Future  Hyperglycemia- his A1c is up to 6.2%, he  is prediabetic but no medication is needed to treat this. He agrees to improve his lifestyle modifications. -     Hemoglobin A1c; Future   I am having Mr. Starks maintain his meloxicam, rosuvastatin, telmisartan, aspirin EC, and clopidogrel.  No orders of the defined types were placed in this encounter.    Follow-up: Return in about 6 months (around 08/02/2017).  Scarlette Calico, MD

## 2017-01-30 NOTE — Patient Instructions (Signed)

## 2017-02-16 ENCOUNTER — Other Ambulatory Visit: Payer: Self-pay | Admitting: Internal Medicine

## 2017-03-26 ENCOUNTER — Other Ambulatory Visit: Payer: Self-pay | Admitting: Internal Medicine

## 2017-03-26 DIAGNOSIS — G453 Amaurosis fugax: Secondary | ICD-10-CM

## 2017-04-18 ENCOUNTER — Telehealth: Payer: Self-pay

## 2017-04-18 DIAGNOSIS — I10 Essential (primary) hypertension: Secondary | ICD-10-CM

## 2017-04-18 DIAGNOSIS — G453 Amaurosis fugax: Secondary | ICD-10-CM

## 2017-04-18 MED ORDER — TELMISARTAN 40 MG PO TABS: 40.0000 mg | ORAL_TABLET | Freq: Every day | ORAL | 0 refills | Status: DC

## 2017-04-18 NOTE — Telephone Encounter (Signed)
Patient has been informed. He has an appointment for 11/6 @930am .

## 2017-04-18 NOTE — Telephone Encounter (Signed)
rf rq for telmisartan.  Appt needed around November for additional refills.  Sending in a 90 day supply.   Will you call patient and schedule him a CPE please.

## 2017-06-14 ENCOUNTER — Other Ambulatory Visit: Payer: Self-pay | Admitting: Internal Medicine

## 2017-06-14 DIAGNOSIS — G453 Amaurosis fugax: Secondary | ICD-10-CM

## 2017-06-14 DIAGNOSIS — E785 Hyperlipidemia, unspecified: Secondary | ICD-10-CM

## 2017-06-21 ENCOUNTER — Other Ambulatory Visit: Payer: Self-pay | Admitting: Internal Medicine

## 2017-06-21 DIAGNOSIS — I10 Essential (primary) hypertension: Secondary | ICD-10-CM

## 2017-06-21 DIAGNOSIS — G453 Amaurosis fugax: Secondary | ICD-10-CM

## 2017-06-21 NOTE — Telephone Encounter (Signed)
Pt wife called regarding this  Please advise  [facility]

## 2017-06-21 NOTE — Telephone Encounter (Signed)
Reviewed chart pt is due for f/u appt in nov sent 30 day until appt...Connor Johnson

## 2017-07-17 ENCOUNTER — Other Ambulatory Visit (INDEPENDENT_AMBULATORY_CARE_PROVIDER_SITE_OTHER): Payer: Medicare Other

## 2017-07-17 ENCOUNTER — Encounter: Payer: Self-pay | Admitting: Internal Medicine

## 2017-07-17 ENCOUNTER — Ambulatory Visit (INDEPENDENT_AMBULATORY_CARE_PROVIDER_SITE_OTHER): Payer: Medicare Other | Admitting: Internal Medicine

## 2017-07-17 VITALS — BP 130/60 | HR 80 | Temp 98.5°F | Resp 16 | Ht 66.5 in | Wt 208.3 lb

## 2017-07-17 DIAGNOSIS — R739 Hyperglycemia, unspecified: Secondary | ICD-10-CM

## 2017-07-17 DIAGNOSIS — N400 Enlarged prostate without lower urinary tract symptoms: Secondary | ICD-10-CM | POA: Diagnosis not present

## 2017-07-17 DIAGNOSIS — I10 Essential (primary) hypertension: Secondary | ICD-10-CM

## 2017-07-17 DIAGNOSIS — E785 Hyperlipidemia, unspecified: Secondary | ICD-10-CM

## 2017-07-17 DIAGNOSIS — Z23 Encounter for immunization: Secondary | ICD-10-CM

## 2017-07-17 LAB — COMPREHENSIVE METABOLIC PANEL
ALT: 13 U/L (ref 0–53)
AST: 15 U/L (ref 0–37)
Albumin: 4.2 g/dL (ref 3.5–5.2)
Alkaline Phosphatase: 45 U/L (ref 39–117)
BILIRUBIN TOTAL: 0.8 mg/dL (ref 0.2–1.2)
BUN: 17 mg/dL (ref 6–23)
CO2: 28 mEq/L (ref 19–32)
CREATININE: 0.95 mg/dL (ref 0.40–1.50)
Calcium: 9.2 mg/dL (ref 8.4–10.5)
Chloride: 103 mEq/L (ref 96–112)
GFR: 82.26 mL/min (ref >60.00)
GLUCOSE: 112 mg/dL — AB (ref 70–99)
POTASSIUM: 4.7 meq/L (ref 3.5–5.1)
Sodium: 138 mEq/L (ref 135–145)
TOTAL PROTEIN: 6.7 g/dL (ref 6.0–8.3)

## 2017-07-17 LAB — PSA: PSA: 2.82 ng/mL (ref 0.10–4.00)

## 2017-07-17 LAB — LIPID PANEL
CHOL/HDL RATIO: 2
Cholesterol: 154 mg/dL (ref 0–200)
HDL: 62.5 mg/dL (ref >39.00)
LDL CALC: 74 mg/dL (ref 0–99)
NonHDL: 91.85
TRIGLYCERIDES: 91 mg/dL (ref 0.0–149.0)
VLDL: 18.2 mg/dL (ref 0.0–40.0)

## 2017-07-17 LAB — CBC WITH DIFFERENTIAL/PLATELET
BASOS ABS: 0 10*3/uL (ref 0.0–0.1)
Basophils Relative: 0.7 % (ref 0.0–3.0)
EOS ABS: 0.2 10*3/uL (ref 0.0–0.7)
Eosinophils Relative: 3 % (ref 0.0–5.0)
HCT: 46.6 % (ref 39.0–52.0)
Hemoglobin: 15.6 g/dL (ref 13.0–17.0)
LYMPHS ABS: 1 10*3/uL (ref 0.7–4.0)
Lymphocytes Relative: 17.4 % (ref 12.0–46.0)
MCHC: 33.5 g/dL (ref 30.0–36.0)
MCV: 96.9 fl (ref 78.0–100.0)
MONO ABS: 0.8 10*3/uL (ref 0.1–1.0)
Monocytes Relative: 13.2 % — ABNORMAL HIGH (ref 3.0–12.0)
NEUTROS ABS: 3.8 10*3/uL (ref 1.4–7.7)
NEUTROS PCT: 65.7 % (ref 43.0–77.0)
PLATELETS: 266 10*3/uL (ref 150.0–400.0)
RBC: 4.81 Mil/uL (ref 4.22–5.81)
RDW: 13 % (ref 11.5–15.5)
WBC: 5.8 10*3/uL (ref 4.0–10.5)

## 2017-07-17 LAB — HEMOGLOBIN A1C: HEMOGLOBIN A1C: 6 % (ref 4.6–6.5)

## 2017-07-17 LAB — TSH: TSH: 1.38 u[IU]/mL (ref 0.35–4.50)

## 2017-07-17 NOTE — Progress Notes (Signed)
Subjective:  Patient ID: Connor Johnson, male    DOB: 13-Mar-1941  Age: 76 y.o. MRN: [id number]  CC: Hypertension and Hyperlipidemia   HPI Hershel Corkery presents for f/up - he has felt well lately and offers no complaints.  He tells me his blood pressure has been well controlled.  He is very active and denies DOE, CP, palpitations, edema, or fatigue.  He is tolerating the statin well with no muscle aches.  Outpatient Medications Prior to Visit  Medication Sig Dispense Refill  . aspirin EC 81 MG tablet Take 1 tablet (81 mg total) by mouth daily. 90 tablet 3  . clopidogrel (PLAVIX) 75 MG tablet TAKE ONE TABLET BY MOUTH ONCE DAILY 90 tablet 1  . meloxicam (MOBIC) 15 MG tablet Take 0.5 tablets (7.5 mg total) by mouth daily. 90 tablet 1  . meloxicam (MOBIC) 15 MG tablet TAKE ONE-HALF TABLET BY MOUTH ONCE DAILY 30 tablet 5  . rosuvastatin (CRESTOR) 20 MG tablet Take 1 tablet (20 mg total) by mouth daily. (Patient taking differently: Take 10 mg daily by mouth. ) 90 tablet 1  . telmisartan (MICARDIS) 40 MG tablet Take 1 tablet (40 mg total) by mouth daily. Follow-up appt is due in nov must see provider for future refills 30 tablet 0   No facility-administered medications prior to visit.     ROS Review of Systems  Constitutional: Negative.  Negative for appetite change, diaphoresis, fatigue and unexpected weight change.  HENT: Negative.  Negative for sinus pressure and trouble swallowing.   Eyes: Negative.  Negative for visual disturbance.  Respiratory: Negative.  Negative for cough, chest tightness, shortness of breath and wheezing.   Cardiovascular: Negative.  Negative for chest pain, palpitations and leg swelling.  Gastrointestinal: Negative for abdominal pain, constipation, diarrhea, nausea and vomiting.  Endocrine: Negative.   Genitourinary: Negative.  Negative for decreased urine volume, difficulty urinating and hematuria.  Musculoskeletal: Negative.  Negative for arthralgias, joint swelling and  myalgias.  Skin: Negative.  Negative for color change.  Allergic/Immunologic: Negative.   Neurological: Negative.  Negative for dizziness and weakness.  Hematological: Negative for adenopathy. Does not bruise/bleed easily.  Psychiatric/Behavioral: Negative.     Objective:  BP 130/60 (BP Location: Left Arm, Patient Position: Sitting, Cuff Size: Large)   Pulse 80   Temp 98.5 F (36.9 C) (Oral)   Resp 16   Ht 5' 6.5" (1.689 m)   Wt 208 lb 5 oz (94.5 kg)   SpO2 95%   BMI 33.12 kg/m   BP Readings from Last 3 Encounters:  07/17/17 130/60  01/30/17 130/88  07/19/16 130/78    Wt Readings from Last 3 Encounters:  07/17/17 208 lb 5 oz (94.5 kg)  01/30/17 208 lb (94.3 kg)  07/19/16 201 lb 12 oz (91.5 kg)    Physical Exam  Constitutional: He is oriented to person, place, and time. No distress.  HENT:  Mouth/Throat: Oropharynx is clear and moist. No oropharyngeal exudate.  Eyes: Conjunctivae are normal. Right eye exhibits no discharge. Left eye exhibits no discharge. No scleral icterus.  Neck: Normal range of motion. Neck supple. No JVD present. No thyromegaly present.  Cardiovascular: Normal rate, regular rhythm and intact distal pulses. Exam reveals no gallop and no friction rub.  No murmur heard. Pulmonary/Chest: Effort normal and breath sounds normal. No respiratory distress. He has no wheezes. He has no rales. He exhibits no tenderness.  Abdominal: Soft. Bowel sounds are normal. He exhibits no distension and no mass. There is no tenderness.  There is no rebound and no guarding. Hernia confirmed negative in the right inguinal area and confirmed negative in the left inguinal area.  Genitourinary: Rectum normal and penis normal. Rectal exam shows no external hemorrhoid, no internal hemorrhoid, no fissure, no mass, no tenderness, anal tone normal and guaiac negative stool. Prostate is enlarged (1+ smooth symm BPH). Prostate is not tender. Right testis shows no mass, no swelling and no  tenderness. Right testis is descended. Left testis shows no mass, no swelling and no tenderness. Left testis is descended. Circumcised. No penile erythema or penile tenderness. No discharge found.  Musculoskeletal: Normal range of motion. He exhibits no edema, tenderness or deformity.  Lymphadenopathy:    He has no cervical adenopathy.       Right: No inguinal adenopathy present.       Left: No inguinal adenopathy present.  Neurological: He is alert and oriented to person, place, and time.  Skin: Skin is warm and dry. No rash noted. He is not diaphoretic. No erythema. No pallor.  Vitals reviewed.   Lab Results  Component Value Date   WBC 5.8 07/17/2017   HGB 15.6 07/17/2017   HCT 46.6 07/17/2017   PLT 266.0 07/17/2017   GLUCOSE 112 (H) 07/17/2017   CHOL 154 07/17/2017   TRIG 91.0 07/17/2017   HDL 62.50 07/17/2017   LDLCALC 74 07/17/2017   ALT 13 07/17/2017   AST 15 07/17/2017   NA 138 07/17/2017   K 4.7 07/17/2017   CL 103 07/17/2017   CREATININE 0.95 07/17/2017   BUN 17 07/17/2017   CO2 28 07/17/2017   TSH 1.38 07/17/2017   PSA 2.82 07/17/2017   HGBA1C 6.0 07/17/2017    Ct Abdomen Pelvis W Contrast  Result Date: 09/01/2015 CLINICAL DATA:  Left lower quadrant pain for 3 days EXAM: CT ABDOMEN AND PELVIS WITH CONTRAST TECHNIQUE: Multidetector CT imaging of the abdomen and pelvis was performed using the standard protocol following bolus administration of intravenous contrast. CONTRAST:  162mL OMNIPAQUE IOHEXOL 300 MG/ML SOLN, 40mL OMNIPAQUE IOHEXOL 300 MG/ML SOLN COMPARISON:  None. FINDINGS: The lung bases are free of acute infiltrate or sizable effusion. The liver, gallbladder, spleen, left adrenal gland and pancreas are within normal limits with the exception of a small hepatic cyst. A small nodule is noted in the right adrenal gland best seen on image number 25 of series 2 measuring less than 1 cm and likely representing a small adenoma. Kidneys are well visualized bilaterally.  No renal calculi or obstructive changes are noted. No abnormal enhancement is seen. The appendix is within normal limits. Diverticular change of the colon is seen with evidence of diverticulitis in the sigmoid colon proximally. No micro perforation or abscess is identified at this time. The bladder is well distended. Changes of penile prosthesis are seen. No pelvic mass lesion is noted. Mild aortic calcifications are seen. Degenerative changes of the lumbar spine are noted. IMPRESSION: Diverticulitis within the sigmoid colon without evidence of abscess or perforation. Likely small right adrenal adenoma. Tiny cyst within the liver. Electronically Signed   By: Inez Catalina M.D.   On: 09/01/2015 13:44    Assessment & Plan:   Ausencio was seen today for hypertension and hyperlipidemia.  Diagnoses and all orders for this visit:  Need for influenza vaccination -     Flu vaccine HIGH DOSE PF (Fluzone High dose)  Need for pneumococcal vaccination -     Pneumococcal polysaccharide vaccine 23-valent greater than or equal to 2yo subcutaneous/IM  Essential hypertension- His blood pressure is well controlled.  Electrolytes and renal function are normal.  Will continue the ARB. -     CBC with Differential/Platelet; Future -     Comprehensive metabolic panel; Future -     TSH; Future  Benign prostatic hyperplasia without lower urinary tract symptoms- his PSA is not rising so I am not concerned about prostate cancer.  He has no symptoms that need to be treated. -     PSA; Future  Hyperglycemia-his A1c is at 6.0%.  He is prediabetic.  Medical therapy is not indicated.  He will continue to work on his lifestyle modifications. -     Hemoglobin A1c; Future  Hyperlipidemia with target LDL less than 70- He has achieved his LDL goal and is doing well on the statin. -     TSH; Future -     Lipid panel; Future   I am having Rennis Harding maintain his meloxicam, aspirin EC, meloxicam, clopidogrel, rosuvastatin, and  telmisartan.  No orders of the defined types were placed in this encounter.    Follow-up: Return in about 6 months (around 01/14/2018).  Scarlette Calico, MD

## 2017-07-17 NOTE — Progress Notes (Unsigned)
Subjective:  Patient ID: Connor Johnson, male    DOB: [birth year]  Age: 76 y.o. MRN: [id number]  CC: No chief complaint on file.   HPI Connor Johnson presents for ***  Outpatient Medications Prior to Visit  Medication Sig Dispense Refill  . aspirin EC 81 MG tablet Take 1 tablet (81 mg total) by mouth daily. 90 tablet 3  . clopidogrel (PLAVIX) 75 MG tablet TAKE ONE TABLET BY MOUTH ONCE DAILY 90 tablet 1  . meloxicam (MOBIC) 15 MG tablet Take 0.5 tablets (7.5 mg total) by mouth daily. 90 tablet 1  . meloxicam (MOBIC) 15 MG tablet TAKE ONE-HALF TABLET BY MOUTH ONCE DAILY 30 tablet 5  . rosuvastatin (CRESTOR) 20 MG tablet Take 1 tablet (20 mg total) by mouth daily. (Patient taking differently: Take 10 mg daily by mouth. ) 90 tablet 1  . telmisartan (MICARDIS) 40 MG tablet Take 1 tablet (40 mg total) by mouth daily. Follow-up appt is due in nov must see provider for future refills 30 tablet 0   No facility-administered medications prior to visit.     ROS Review of Systems  Objective:  There were no vitals taken for this visit.  BP Readings from Last 3 Encounters:  07/17/17 130/60  01/30/17 130/88  07/19/16 130/78    Wt Readings from Last 3 Encounters:  07/17/17 208 lb 5 oz (94.5 kg)  01/30/17 208 lb (94.3 kg)  07/19/16 201 lb 12 oz (91.5 kg)    Physical Exam  Lab Results  Component Value Date   WBC 5.8 07/17/2017   HGB 15.6 07/17/2017   HCT 46.6 07/17/2017   PLT 266.0 07/17/2017   GLUCOSE 112 (H) 07/17/2017   CHOL 154 07/17/2017   TRIG 91.0 07/17/2017   HDL 62.50 07/17/2017   LDLCALC 74 07/17/2017   ALT 13 07/17/2017   AST 15 07/17/2017   NA 138 07/17/2017   K 4.7 07/17/2017   CL 103 07/17/2017   CREATININE 0.95 07/17/2017   BUN 17 07/17/2017   CO2 28 07/17/2017   TSH 1.38 07/17/2017   PSA 2.82 07/17/2017   HGBA1C 6.0 07/17/2017    Ct Abdomen Pelvis W Contrast  Result Date: 09/01/2015 CLINICAL DATA:  Left lower quadrant pain for 3 days EXAM: CT ABDOMEN AND  PELVIS WITH CONTRAST TECHNIQUE: Multidetector CT imaging of the abdomen and pelvis was performed using the standard protocol following bolus administration of intravenous contrast. CONTRAST:  198mL OMNIPAQUE IOHEXOL 300 MG/ML SOLN, 49mL OMNIPAQUE IOHEXOL 300 MG/ML SOLN COMPARISON:  None. FINDINGS: The lung bases are free of acute infiltrate or sizable effusion. The liver, gallbladder, spleen, left adrenal gland and pancreas are within normal limits with the exception of a small hepatic cyst. A small nodule is noted in the right adrenal gland best seen on image number 25 of series 2 measuring less than 1 cm and likely representing a small adenoma. Kidneys are well visualized bilaterally. No renal calculi or obstructive changes are noted. No abnormal enhancement is seen. The appendix is within normal limits. Diverticular change of the colon is seen with evidence of diverticulitis in the sigmoid colon proximally. No micro perforation or abscess is identified at this time. The bladder is well distended. Changes of penile prosthesis are seen. No pelvic mass lesion is noted. Mild aortic calcifications are seen. Degenerative changes of the lumbar spine are noted. IMPRESSION: Diverticulitis within the sigmoid colon without evidence of abscess or perforation. Likely small right adrenal adenoma. Tiny cyst within the liver. Electronically Signed  By: Inez Catalina M.D.   On: 09/01/2015 13:44    Assessment & Plan:   There are no diagnoses linked to this encounter. I am having Rennis Harding maintain his meloxicam, aspirin EC, meloxicam, clopidogrel, rosuvastatin, and telmisartan.  No orders of the defined types were placed in this encounter.    Follow-up: No Follow-up on file.  Scarlette Calico, MD

## 2017-07-17 NOTE — Patient Instructions (Signed)

## 2017-07-30 DIAGNOSIS — H6121 Impacted cerumen, right ear: Secondary | ICD-10-CM | POA: Diagnosis not present

## 2017-07-30 NOTE — Progress Notes (Signed)
Subjective:   Connor Johnson is a 76 y.o. male who presents for Medicare Annual/Subsequent preventive examination.  Review of Systems:  No ROS.  Medicare Wellness Visit. Additional risk factors are reflected in the social history.  Cardiac Risk Factors include: advanced age (>61men, >30 women);dyslipidemia;hypertension;male gender Sleep patterns: feels rested on waking, gets up 1-2 times nightly to void and sleeps 8 hours nightly.    Home Safety/Smoke Alarms: Feels safe in home. Smoke alarms in place.  Living environment; residence and Firearm Safety: 2-story house, no firearms. Lives with wife, no needs for DME, good support system Seat Belt Safety/Bike Helmet: Wears seat belt.     Objective:    Vitals: There were no vitals taken for this visit.  There is no height or weight on file to calculate BMI.  Tobacco Social History   Tobacco Use  Smoking Status Former Smoker  . Types: Cigarettes  . Last attempt to quit: 09/16/1995  . Years since quitting: 21.8  Smokeless Tobacco Never Used     Counseling given: Not Answered   Past Medical History:  Diagnosis Date  . Arthritis   . Cataract   . COPD (chronic obstructive pulmonary disease) ([place])   . Diverticulitis   . Diverticulosis   . HLD (hyperlipidemia)   . Hypertension   . TIA (transient ischemic attack)    Past Surgical History:  Procedure Laterality Date  . PENILE PROSTHESIS IMPLANT     Family History  Problem Relation Age of Onset  . Liver cancer Father   . Prostate cancer Brother   . Diabetes Brother   . Heart disease Paternal Grandmother   . Colon cancer Neg Hx   . Esophageal cancer Neg Hx   . Rectal cancer Neg Hx   . Stomach cancer Neg Hx    Social History   Substance and Sexual Activity  Sexual Activity Yes    Outpatient Encounter Medications as of 07/31/2017  Medication Sig  . aspirin EC 81 MG tablet Take 1 tablet (81 mg total) by mouth daily.  . clopidogrel (PLAVIX) 75 MG tablet TAKE ONE TABLET BY  MOUTH ONCE DAILY  . meloxicam (MOBIC) 15 MG tablet TAKE ONE-HALF TABLET BY MOUTH ONCE DAILY  . rosuvastatin (CRESTOR) 20 MG tablet Take 1 tablet (20 mg total) by mouth daily. (Patient taking differently: Take 10 mg daily by mouth. )  . telmisartan (MICARDIS) 40 MG tablet Take 1 tablet (40 mg total) by mouth daily. Follow-up appt is due in nov must see provider for future refills  . [DISCONTINUED] meloxicam (MOBIC) 15 MG tablet Take 0.5 tablets (7.5 mg total) by mouth daily. (Patient not taking: Reported on 07/31/2017)   No facility-administered encounter medications on file as of 07/31/2017.     Activities of Daily Living In your present state of health, do you have any difficulty performing the following activities: 07/31/2017  Hearing? N  Vision? N  Difficulty concentrating or making decisions? N  Walking or climbing stairs? N  Dressing or bathing? N  Doing errands, shopping? N  Preparing Food and eating ? N  Using the Toilet? N  In the past six months, have you accidently leaked urine? N  Do you have problems with loss of bowel control? N  Managing your Medications? N  Managing your Finances? N  Housekeeping or managing your Housekeeping? N  Some recent data might be hidden    Patient Care Team: Janith Lima, MD as PCP - General (Internal Medicine)   Assessment:  Physical assessment deferred to PCP.  Exercise Activities and Dietary recommendations Current Exercise Habits: Structured exercise class, Type of exercise: strength training/weights;treadmill;calisthenics(stationary bike), Time (Minutes): 60, Frequency (Times/Week): 2, Weekly Exercise (Minutes/Week): 120, Intensity: Moderate, Exercise limited by: None identified  Diet (meal preparation, eat out, water intake, caffeinated beverages, dairy products, fruits and vegetables): in general, a "healthy" diet  , well balanced, eats a variety of fruits and vegetables daily, limits salt, fat/cholesterol, sugar, caffeine,  drinks 1 glasses of water daily.  Diet education was attached to patient's AVS, encouraged patient to increase daily water intake.   Goals    . Patient Stated     Stay as healthy as active as possible. Continue to exercise start to eat healthier by reading food labels, monitoring carbohydrates, increase water intake,  and fat. Enjoy life and family.       Fall Risk Fall Risk  07/31/2017 01/30/2017 10/18/2015  Falls in the past year? No No No   Depression Screen PHQ 2/9 Scores 07/31/2017 01/30/2017 10/18/2015  PHQ - 2 Score 0 0 0  PHQ- 9 Score 0 1 -    Cognitive Function MMSE - Mini Mental State Exam 07/31/2017  Orientation to time 5  Orientation to Place 5  Registration 3  Attention/ Calculation 5  Recall 2  Language- name 2 objects 2  Language- repeat 1  Language- follow 3 step command 3  Language- read & follow direction 1  Write a sentence 1  Copy design 1  Total score 29        Immunization History  Administered Date(s) Administered  . Influenza, High Dose Seasonal PF 07/19/2016, 07/17/2017  . Influenza-Unspecified 06/12/2015  . Pneumococcal Conjugate-13 07/19/2016  . Pneumococcal Polysaccharide-23 07/17/2017  . Tdap 05/21/2013   Screening Tests Health Maintenance  Topic Date Due  . COLONOSCOPY  09/26/2018  . TETANUS/TDAP  05/22/2023  . INFLUENZA VACCINE  Completed  . PNA vac Low Risk Adult  Completed      Plan:    Continue doing brain stimulating activities (puzzles, reading, adult coloring books, staying active) to keep memory sharp.   Continue to eat heart healthy diet (full of fruits, vegetables, whole grains, lean protein, water--limit salt, fat, and sugar intake) and increase physical activity as tolerated.  I have personally reviewed and noted the following in the patient's chart:   . Medical and social history . Use of alcohol, tobacco or illicit drugs  . Current medications and supplements . Functional ability and status . Nutritional  status . Physical activity . Advanced directives . List of other physicians . Vitals . Screenings to include cognitive, depression, and falls . Referrals and appointments  In addition, I have reviewed and discussed with patient certain preventive protocols, quality metrics, and best practice recommendations. A written personalized care plan for preventive services as well as general preventive health recommendations were provided to patient.     Michiel Cowboy, RN  07/31/2017

## 2017-07-31 ENCOUNTER — Ambulatory Visit (INDEPENDENT_AMBULATORY_CARE_PROVIDER_SITE_OTHER): Payer: Medicare Other | Admitting: *Deleted

## 2017-07-31 DIAGNOSIS — Z0000 Encounter for general adult medical examination without abnormal findings: Secondary | ICD-10-CM | POA: Diagnosis not present

## 2017-07-31 NOTE — Progress Notes (Signed)
Medical screening examination/treatment/procedure(s) were performed by non-physician practitioner and as supervising physician I was immediately available for consultation/collaboration. I agree with above. Elizabeth A Crawford, MD 

## 2017-07-31 NOTE — Patient Instructions (Addendum)
[facility] [place], [place] [phone]  [facility] in [place], [place] Address: [address], [place], [place] [address] [address], [place] [address]  [phone]  Continue doing brain stimulating activities (puzzles, reading, adult coloring books, staying active) to keep memory sharp.   Continue to eat heart healthy diet (full of fruits, vegetables, whole grains, lean protein, water--limit salt, fat, and sugar intake) and increase physical activity as tolerated.   Connor Johnson , Thank you for taking time to come for your Medicare Wellness Visit. I appreciate your ongoing commitment to your health goals. Please review the following plan we discussed and let me know if I can assist you in the future.   These are the goals we discussed: Goals    . Patient Stated     Stay as healthy as active as possible. Continue to exercise start to eat healthier by reading food labels, monitoring carbohydrates, increase water intake,  and fat. Enjoy life and family.        This is a list of the screening recommended for you and due dates:  Health Maintenance  Topic Date Due  . Colon Cancer Screening  09/26/2018  . Tetanus Vaccine  05/22/2023  . Flu Shot  Completed  . Pneumonia vaccines  Completed     Protein Content in Foods Generally, most healthy people need around 50 grams of protein each day. Depending on your overall health, you may need more or less protein in your diet. Talk to your health care provider or dietitian about how much protein you need. See the following list for the protein content of some common foods. High-protein foods High-protein foods contain 4 grams (4 g) or more of protein per serving. They include:  Beef, ground sirloin (cooked) - 3 oz have 24 g of protein.  Cheese (hard) - 1 oz has 7 g of protein.  Chicken breast, boneless and skinless (cooked) - 3 oz have 13.4 g of  protein.  Cottage cheese - 1/2 cup has 13.4 g of protein.  Egg - 1 egg has 6 g of protein.  Fish, filet (cooked) - 1 oz has 6-7 g of protein.  Garbanzo beans (canned or cooked) - 1/2 cup has 6-7 g of protein.  Kidney beans (canned or cooked) - 1/2 cup has 6-7 g of protein.  Lamb (cooked) - 3 oz has 24 g of protein.  Milk - 1 cup (8 oz) has 8 g of protein.  Nuts (peanuts, pistachios, almonds) - 1 oz has 6 g of protein.  Peanut butter - 1 oz has 7-8 g of protein.  Pork tenderloin (cooked) - 3 oz has 18.4 g of protein.  Pumpkin seeds - 1 oz has 8.5 g of protein.  Soybeans (roasted) - 1 oz has 8 g of protein.  Soybeans (cooked) - 1/2 cup has 11 g of protein.  Soy milk - 1 cup (8 oz) has 5-10 g of protein.  Soy or vegetable patty - 1 patty has 11 g of protein.  Sunflower seeds - 1 oz has 5.5 g of protein.  Tofu (firm) - 1/2 cup has 20 g of protein.  Tuna (canned in water) - 3 oz has 20 g of protein.  Yogurt - 6 oz has 8 g of protein.  Low-protein foods Low-protein foods contain 3 grams (3 g) or less of protein per serving. They include:  Beets (raw or cooked) - 1/2  cup has 1.5 g of protein.  Bran cereal - 1/2 cup has 2-3 g of protein.  Bread - 1 slice has 2.5 g of protein.  Broccoli (raw or cooked) - 1/2 cup has 2 g of protein.  Collard greens (raw or cooked) - 1/2 cup has 2 g of protein.  Corn (fresh or cooked) - 1/2 cup has 2 g of protein.  Cream cheese - 1 oz has 2 g of protein.  Creamer (half-and-half) - 1 oz has 1 g of protein.  Flour tortilla - 1 tortilla has 2.5 g of protein  Frozen yogurt - 1/2 cup has 3 g of protein.  Fruit or vegetable juice - 1/2 cup has 1 g of protein.  Green beans (raw or cooked) - 1/2 cup has 1 g of protein.  Green peas (canned) - 1/2 cup has 3.5 g of protein.  Muffins - 1 small muffin (2 oz) has 3 g of protein.  Oatmeal (cooked) - 1/2 cup has 3 g of protein.  Potato (baked with skin) - 1 medium potato has 3 g of  protein.  Rice (cooked) - 1/2 cup has 2.5-3.5 g of protein.  Sour cream - 1/2 cup has 2.5 g of protein.  Spinach (cooked) - 1/2 cup has 3 g of protein.  Squash (cooked) - 1/2 cup has 1.5 g of protein.  Actual amounts of protein may be different depending on processing. Talk with your health care provider or dietitian about what foods are recommended for you. This information is not intended to replace advice given to you by your health care provider. Make sure you discuss any questions you have with your health care provider. Document Released: 11/27/2015 Document Revised: 05/08/2016 Document Reviewed: 05/08/2016 Elsevier Interactive Patient Education  2018 [employer].  Carbohydrate Counting for Diabetes Mellitus, Adult Carbohydrate counting is a method for keeping track of how many carbohydrates you eat. Eating carbohydrates naturally increases the amount of sugar (glucose) in the blood. Counting how many carbohydrates you eat helps keep your blood glucose within normal limits, which helps you manage your diabetes (diabetes mellitus). It is important to know how many carbohydrates you can safely have in each meal. This is different for every person. A diet and nutrition specialist (registered dietitian) can help you make a meal plan and calculate how many carbohydrates you should have at each meal and snack. Carbohydrates are found in the following foods:  Grains, such as breads and cereals.  Dried beans and soy products.  Starchy vegetables, such as potatoes, peas, and corn.  Fruit and fruit juices.  Milk and yogurt.  Sweets and snack foods, such as cake, cookies, candy, chips, and soft drinks.  How do I count carbohydrates? There are two ways to count carbohydrates in food. You can use either of the methods or a combination of both. Reading "Nutrition Facts" on packaged food The "Nutrition Facts" list is included on the labels of almost all packaged foods and beverages in the  [place] It includes:  The serving size.  Information about nutrients in each serving, including the grams (g) of carbohydrate per serving.  To use the "Nutrition Facts":  Decide how many servings you will have.  Multiply the number of servings by the number of carbohydrates per serving.  The resulting number is the total amount of carbohydrates that you will be having.  Learning standard serving sizes of other foods When you eat foods containing carbohydrates that are not packaged or do not include "Nutrition Facts"  on the label, you need to measure the servings in order to count the amount of carbohydrates:  Measure the foods that you will eat with a food scale or measuring cup, if needed.  Decide how many standard-size servings you will eat.  Multiply the number of servings by 15. Most carbohydrate-rich foods have about 15 g of carbohydrates per serving. ? For example, if you eat 8 oz (170 g) of strawberries, you will have eaten 2 servings and 30 g of carbohydrates (2 servings x 15 g = 30 g).  For foods that have more than one food mixed, such as soups and casseroles, you must count the carbohydrates in each food that is included.  The following list contains standard serving sizes of common carbohydrate-rich foods. Each of these servings has about 15 g of carbohydrates:   hamburger bun or  English muffin.   oz (15 mL) syrup.   oz (14 g) jelly.  1 slice of bread.  1 six-inch tortilla.  3 oz (85 g) cooked rice or pasta.  4 oz (113 g) cooked dried beans.  4 oz (113 g) starchy vegetable, such as peas, corn, or potatoes.  4 oz (113 g) hot cereal.  4 oz (113 g) mashed potatoes or  of a large baked potato.  4 oz (113 g) canned or frozen fruit.  4 oz (120 mL) fruit juice.  4-6 crackers.  6 chicken nuggets.  6 oz (170 g) unsweetened dry cereal.  6 oz (170 g) plain fat-free yogurt or yogurt sweetened with artificial sweeteners.  8 oz (240 mL) milk.  8 oz (170  g) fresh fruit or one small piece of fruit.  24 oz (680 g) popped popcorn.  Example of carbohydrate counting Sample meal  3 oz (85 g) chicken breast.  6 oz (170 g) brown rice.  4 oz (113 g) corn.  8 oz (240 mL) milk.  8 oz (170 g) strawberries with sugar-free whipped topping. Carbohydrate calculation 1. Identify the foods that contain carbohydrates: ? Rice. ? Corn. ? Milk. ? Strawberries. 2. Calculate how many servings you have of each food: ? 2 servings rice. ? 1 serving corn. ? 1 serving milk. ? 1 serving strawberries. 3. Multiply each number of servings by 15 g: ? 2 servings rice x 15 g = 30 g. ? 1 serving corn x 15 g = 15 g. ? 1 serving milk x 15 g = 15 g. ? 1 serving strawberries x 15 g = 15 g. 4. Add together all of the amounts to find the total grams of carbohydrates eaten: ? 30 g + 15 g + 15 g + 15 g = 75 g of carbohydrates total. This information is not intended to replace advice given to you by your health care provider. Make sure you discuss any questions you have with your health care provider. Document Released: 08/28/2005 Document Revised: 03/17/2016 Document Reviewed: 02/09/2016 Elsevier Interactive Patient Education  [employer].

## 2017-08-14 ENCOUNTER — Other Ambulatory Visit: Payer: Self-pay | Admitting: Internal Medicine

## 2017-08-14 DIAGNOSIS — G453 Amaurosis fugax: Secondary | ICD-10-CM

## 2017-08-14 DIAGNOSIS — I10 Essential (primary) hypertension: Secondary | ICD-10-CM

## 2017-09-26 ENCOUNTER — Other Ambulatory Visit: Payer: Self-pay | Admitting: Internal Medicine

## 2017-09-26 DIAGNOSIS — G453 Amaurosis fugax: Secondary | ICD-10-CM

## 2017-10-02 DIAGNOSIS — H43393 Other vitreous opacities, bilateral: Secondary | ICD-10-CM | POA: Diagnosis not present

## 2017-10-02 DIAGNOSIS — H04123 Dry eye syndrome of bilateral lacrimal glands: Secondary | ICD-10-CM | POA: Diagnosis not present

## 2017-10-02 DIAGNOSIS — H02834 Dermatochalasis of left upper eyelid: Secondary | ICD-10-CM | POA: Diagnosis not present

## 2017-10-02 DIAGNOSIS — H02831 Dermatochalasis of right upper eyelid: Secondary | ICD-10-CM | POA: Diagnosis not present

## 2017-10-02 DIAGNOSIS — H52203 Unspecified astigmatism, bilateral: Secondary | ICD-10-CM | POA: Diagnosis not present

## 2017-10-02 DIAGNOSIS — H2513 Age-related nuclear cataract, bilateral: Secondary | ICD-10-CM | POA: Diagnosis not present

## 2017-10-02 DIAGNOSIS — H524 Presbyopia: Secondary | ICD-10-CM | POA: Diagnosis not present

## 2017-11-24 ENCOUNTER — Other Ambulatory Visit: Payer: Self-pay | Admitting: Internal Medicine

## 2017-11-24 DIAGNOSIS — G459 Transient cerebral ischemic attack, unspecified: Secondary | ICD-10-CM

## 2017-12-24 ENCOUNTER — Other Ambulatory Visit: Payer: Self-pay | Admitting: Internal Medicine

## 2017-12-24 DIAGNOSIS — G453 Amaurosis fugax: Secondary | ICD-10-CM

## 2017-12-26 DIAGNOSIS — L299 Pruritus, unspecified: Secondary | ICD-10-CM | POA: Diagnosis not present

## 2018-01-30 ENCOUNTER — Telehealth: Payer: Self-pay | Admitting: Emergency Medicine

## 2018-01-30 NOTE — Telephone Encounter (Signed)
error 

## 2018-02-11 ENCOUNTER — Other Ambulatory Visit: Payer: Self-pay | Admitting: Internal Medicine

## 2018-02-11 DIAGNOSIS — I10 Essential (primary) hypertension: Secondary | ICD-10-CM

## 2018-02-11 DIAGNOSIS — G453 Amaurosis fugax: Secondary | ICD-10-CM

## 2018-02-21 ENCOUNTER — Other Ambulatory Visit (INDEPENDENT_AMBULATORY_CARE_PROVIDER_SITE_OTHER): Payer: Medicare Other

## 2018-02-21 ENCOUNTER — Encounter: Payer: Self-pay | Admitting: Internal Medicine

## 2018-02-21 ENCOUNTER — Ambulatory Visit (INDEPENDENT_AMBULATORY_CARE_PROVIDER_SITE_OTHER): Payer: Medicare Other | Admitting: Internal Medicine

## 2018-02-21 VITALS — BP 140/80 | HR 80 | Temp 98.4°F | Resp 16 | Ht 66.5 in | Wt 203.0 lb

## 2018-02-21 DIAGNOSIS — E785 Hyperlipidemia, unspecified: Secondary | ICD-10-CM

## 2018-02-21 DIAGNOSIS — R7303 Prediabetes: Secondary | ICD-10-CM

## 2018-02-21 DIAGNOSIS — I10 Essential (primary) hypertension: Secondary | ICD-10-CM

## 2018-02-21 DIAGNOSIS — M159 Polyosteoarthritis, unspecified: Secondary | ICD-10-CM

## 2018-02-21 DIAGNOSIS — G453 Amaurosis fugax: Secondary | ICD-10-CM

## 2018-02-21 LAB — BASIC METABOLIC PANEL
BUN: 20 mg/dL (ref 6–23)
CHLORIDE: 104 meq/L (ref 96–112)
CO2: 27 meq/L (ref 19–32)
CREATININE: 1.11 mg/dL (ref 0.40–1.50)
Calcium: 9.3 mg/dL (ref 8.4–10.5)
GFR: 68.62 mL/min (ref >60.00)
GLUCOSE: 96 mg/dL (ref 70–99)
Potassium: 4.1 mEq/L (ref 3.5–5.1)
Sodium: 139 mEq/L (ref 135–145)

## 2018-02-21 LAB — HEMOGLOBIN A1C: Hgb A1c MFr Bld: 6.3 % (ref 4.6–6.5)

## 2018-02-21 MED ORDER — TELMISARTAN 40 MG PO TABS: 40.0000 mg | ORAL_TABLET | Freq: Every day | ORAL | 1 refills | Status: DC

## 2018-02-21 MED ORDER — MELOXICAM 15 MG PO TABS: ORAL_TABLET | ORAL | 1 refills | Status: DC

## 2018-02-21 MED ORDER — CLOPIDOGREL BISULFATE 75 MG PO TABS: 75.0000 mg | ORAL_TABLET | Freq: Every day | ORAL | 1 refills | Status: DC

## 2018-02-21 MED ORDER — ROSUVASTATIN CALCIUM 20 MG PO TABS: 10.0000 mg | ORAL_TABLET | Freq: Every day | ORAL | 1 refills | Status: DC

## 2018-02-21 NOTE — Patient Instructions (Signed)

## 2018-02-21 NOTE — Progress Notes (Signed)
Subjective:  Patient ID: Connor Johnson, male    DOB: 1940-11-12  Age: 77 y.o. MRN: [id number]  CC: Hypertension and Osteoarthritis   HPI Connor Johnson presents for a BP check -he tells me his blood pressure has been well controlled.  He is very active and works out at [employer].  He denies any recent episodes of DOE, CP, palpitations, shortness of breath, edema, or fatigue.  Outpatient Medications Prior to Visit  Medication Sig Dispense Refill  . EQ ASPIRIN ADULT LOW DOSE 81 MG EC tablet TAKE ONE TABLET BY MOUTH ONCE DAILY 90 tablet 1  . clopidogrel (PLAVIX) 75 MG tablet TAKE 1 TABLET BY MOUTH ONCE DAILY 90 tablet 0  . meloxicam (MOBIC) 15 MG tablet TAKE 1/2 (ONE-HALF) TABLET BY MOUTH ONCE DAILY 30 tablet 0  . rosuvastatin (CRESTOR) 20 MG tablet Take 1 tablet (20 mg total) by mouth daily. (Patient taking differently: Take 10 mg daily by mouth. ) 90 tablet 1  . telmisartan (MICARDIS) 40 MG tablet TAKE 1 TABLET BY MOUTH DAILY 90 tablet 0   No facility-administered medications prior to visit.     ROS Review of Systems  Constitutional: Negative for diaphoresis and fatigue.  HENT: Negative.   Eyes: Negative for visual disturbance.  Respiratory: Negative for apnea, cough, chest tightness, shortness of breath and wheezing.   Cardiovascular: Negative for chest pain and leg swelling.  Gastrointestinal: Negative for abdominal pain, constipation, diarrhea, nausea and vomiting.  Endocrine: Negative.   Genitourinary: Negative.   Musculoskeletal: Positive for arthralgias. Negative for myalgias and neck pain.  Skin: Negative.  Negative for color change and rash.  Allergic/Immunologic: Negative.   Neurological: Negative.  Negative for dizziness, weakness, light-headedness and headaches.  Hematological: Negative for adenopathy. Does not bruise/bleed easily.  Psychiatric/Behavioral: Negative.     Objective:  BP 140/80 (BP Location: Left Arm, Patient Position: Sitting, Cuff Size: Normal)   Pulse 80    Temp 98.4 F (36.9 C) (Oral)   Resp 16   Ht 5' 6.5" (1.689 m)   Wt 203 lb (92.1 kg)   SpO2 94%   BMI 32.27 kg/m   BP Readings from Last 3 Encounters:  02/21/18 140/80  07/17/17 130/60  01/30/17 130/88    Wt Readings from Last 3 Encounters:  02/21/18 203 lb (92.1 kg)  07/17/17 208 lb 5 oz (94.5 kg)  01/30/17 208 lb (94.3 kg)    Physical Exam  Constitutional: He is oriented to person, place, and time. No distress.  HENT:  Mouth/Throat: Oropharynx is clear and moist. No oropharyngeal exudate.  Eyes: Conjunctivae are normal. No scleral icterus.  Neck: Normal range of motion. Neck supple. No JVD present.  Cardiovascular: Normal rate, regular rhythm and normal heart sounds.  No murmur heard. Pulmonary/Chest: Effort normal and breath sounds normal. He has no wheezes. He has no rales.  Abdominal: Soft. Bowel sounds are normal. He exhibits no mass. There is no hepatosplenomegaly. There is no tenderness. No hernia.  Musculoskeletal: Normal range of motion. He exhibits no edema, tenderness or deformity.  Lymphadenopathy:    He has no cervical adenopathy.  Neurological: He is alert and oriented to person, place, and time.  Skin: Skin is warm and dry. No rash noted. He is not diaphoretic. No erythema. No pallor.  Vitals reviewed.   Lab Results  Component Value Date   WBC 5.8 07/17/2017   HGB 15.6 07/17/2017   HCT 46.6 07/17/2017   PLT 266.0 07/17/2017   GLUCOSE 96 02/21/2018   CHOL  154 07/17/2017   TRIG 91.0 07/17/2017   HDL 62.50 07/17/2017   LDLCALC 74 07/17/2017   ALT 13 07/17/2017   AST 15 07/17/2017   NA 139 02/21/2018   K 4.1 02/21/2018   CL 104 02/21/2018   CREATININE 1.11 02/21/2018   BUN 20 02/21/2018   CO2 27 02/21/2018   TSH 1.38 07/17/2017   PSA 2.82 07/17/2017   HGBA1C 6.3 02/21/2018    Ct Abdomen Pelvis W Contrast  Result Date: 09/01/2015 CLINICAL DATA:  Left lower quadrant pain for 3 days EXAM: CT ABDOMEN AND PELVIS WITH CONTRAST TECHNIQUE:  Multidetector CT imaging of the abdomen and pelvis was performed using the standard protocol following bolus administration of intravenous contrast. CONTRAST:  184mL OMNIPAQUE IOHEXOL 300 MG/ML SOLN, 53mL OMNIPAQUE IOHEXOL 300 MG/ML SOLN COMPARISON:  None. FINDINGS: The lung bases are free of acute infiltrate or sizable effusion. The liver, gallbladder, spleen, left adrenal gland and pancreas are within normal limits with the exception of a small hepatic cyst. A small nodule is noted in the right adrenal gland best seen on image number 25 of series 2 measuring less than 1 cm and likely representing a small adenoma. Kidneys are well visualized bilaterally. No renal calculi or obstructive changes are noted. No abnormal enhancement is seen. The appendix is within normal limits. Diverticular change of the colon is seen with evidence of diverticulitis in the sigmoid colon proximally. No micro perforation or abscess is identified at this time. The bladder is well distended. Changes of penile prosthesis are seen. No pelvic mass lesion is noted. Mild aortic calcifications are seen. Degenerative changes of the lumbar spine are noted. IMPRESSION: Diverticulitis within the sigmoid colon without evidence of abscess or perforation. Likely small right adrenal adenoma. Tiny cyst within the liver. Electronically Signed   By: Connor Johnson M.D.   On: 09/01/2015 13:44    Assessment & Plan:   Connor Johnson was seen today for hypertension and osteoarthritis.  Diagnoses and all orders for this visit:  Prediabetes- His A1c is at 6.3%.  Medical therapy is not indicated at this time. -     Basic metabolic panel; Future -     Hemoglobin A1c; Future  Essential hypertension- His blood pressure is well controlled.  Electrolytes and renal function are normal. -     Basic metabolic panel; Future -     telmisartan (MICARDIS) 40 MG tablet; Take 1 tablet (40 mg total) by mouth daily.  Amaurosis fugax- He has had no recurrent symptoms.  Will  continue aggressive risk factor modification to reduce the risk of cardiovascular event. -     telmisartan (MICARDIS) 40 MG tablet; Take 1 tablet (40 mg total) by mouth daily. -     rosuvastatin (CRESTOR) 20 MG tablet; Take 0.5 tablets (10 mg total) by mouth daily. -     clopidogrel (PLAVIX) 75 MG tablet; Take 1 tablet (75 mg total) by mouth daily.  Hyperlipidemia with target LDL less than 70-he has achieved his LDL goal and is doing well on the statin. -     rosuvastatin (CRESTOR) 20 MG tablet; Take 0.5 tablets (10 mg total) by mouth daily.  Generalized OA -     meloxicam (MOBIC) 15 MG tablet; TAKE 1/2 (ONE-HALF) TABLET BY MOUTH ONCE DAILY   I have changed Connor Johnson's telmisartan, rosuvastatin, and clopidogrel. I am also having him maintain his EQ ASPIRIN ADULT LOW DOSE and meloxicam.  Meds ordered this encounter  Medications  . telmisartan (MICARDIS) 40 MG tablet  Sig: Take 1 tablet (40 mg total) by mouth daily.    Dispense:  90 tablet    Refill:  1  . rosuvastatin (CRESTOR) 20 MG tablet    Sig: Take 0.5 tablets (10 mg total) by mouth daily.    Dispense:  45 tablet    Refill:  1  . meloxicam (MOBIC) 15 MG tablet    Sig: TAKE 1/2 (ONE-HALF) TABLET BY MOUTH ONCE DAILY    Dispense:  90 tablet    Refill:  1    Please consider 90 day supplies to promote better adherence  . clopidogrel (PLAVIX) 75 MG tablet    Sig: Take 1 tablet (75 mg total) by mouth daily.    Dispense:  90 tablet    Refill:  1     Follow-up: Return in about 6 months (around 08/23/2018).  Scarlette Calico, MD

## 2018-04-10 DIAGNOSIS — H6501 Acute serous otitis media, right ear: Secondary | ICD-10-CM | POA: Diagnosis not present

## 2018-05-25 ENCOUNTER — Other Ambulatory Visit: Payer: Self-pay | Admitting: Internal Medicine

## 2018-05-25 DIAGNOSIS — G459 Transient cerebral ischemic attack, unspecified: Secondary | ICD-10-CM

## 2018-07-29 ENCOUNTER — Other Ambulatory Visit: Payer: Self-pay

## 2018-08-06 ENCOUNTER — Ambulatory Visit (INDEPENDENT_AMBULATORY_CARE_PROVIDER_SITE_OTHER): Payer: Medicare Other | Admitting: Internal Medicine

## 2018-08-06 ENCOUNTER — Encounter: Payer: Self-pay | Admitting: Internal Medicine

## 2018-08-06 ENCOUNTER — Ambulatory Visit (INDEPENDENT_AMBULATORY_CARE_PROVIDER_SITE_OTHER)
Admission: RE | Admit: 2018-08-06 | Discharge: 2018-08-06 | Disposition: A | Discharge status: Home / Self Care | Payer: Medicare Other | Source: Ambulatory Visit | Attending: Internal Medicine | Admitting: Internal Medicine

## 2018-08-06 ENCOUNTER — Ambulatory Visit: Payer: Medicare Other

## 2018-08-06 ENCOUNTER — Other Ambulatory Visit (INDEPENDENT_AMBULATORY_CARE_PROVIDER_SITE_OTHER): Payer: Medicare Other

## 2018-08-06 VITALS — BP 140/88 | HR 80 | Temp 98.0°F | Resp 16 | Ht 66.5 in | Wt 205.8 lb

## 2018-08-06 DIAGNOSIS — R05 Cough: Secondary | ICD-10-CM | POA: Diagnosis not present

## 2018-08-06 DIAGNOSIS — R7303 Prediabetes: Secondary | ICD-10-CM

## 2018-08-06 DIAGNOSIS — R413 Other amnesia: Secondary | ICD-10-CM | POA: Diagnosis not present

## 2018-08-06 DIAGNOSIS — I10 Essential (primary) hypertension: Secondary | ICD-10-CM | POA: Diagnosis not present

## 2018-08-06 DIAGNOSIS — Z0000 Encounter for general adult medical examination without abnormal findings: Secondary | ICD-10-CM

## 2018-08-06 DIAGNOSIS — Z23 Encounter for immunization: Secondary | ICD-10-CM | POA: Diagnosis not present

## 2018-08-06 DIAGNOSIS — R059 Cough, unspecified: Secondary | ICD-10-CM

## 2018-08-06 DIAGNOSIS — N400 Enlarged prostate without lower urinary tract symptoms: Secondary | ICD-10-CM | POA: Diagnosis not present

## 2018-08-06 DIAGNOSIS — E785 Hyperlipidemia, unspecified: Secondary | ICD-10-CM | POA: Diagnosis not present

## 2018-08-06 LAB — COMPREHENSIVE METABOLIC PANEL
ALBUMIN: 4.3 g/dL (ref 3.5–5.2)
ALK PHOS: 47 U/L (ref 39–117)
ALT: 13 U/L (ref 0–53)
AST: 14 U/L (ref 0–37)
BUN: 17 mg/dL (ref 6–23)
CALCIUM: 9.1 mg/dL (ref 8.4–10.5)
CO2: 28 mEq/L (ref 19–32)
Chloride: 102 mEq/L (ref 96–112)
Creatinine, Ser: 0.99 mg/dL (ref 0.40–1.50)
GFR: 78.21 mL/min (ref >60.00)
Glucose, Bld: 121 mg/dL — ABNORMAL HIGH (ref 70–99)
POTASSIUM: 4.4 meq/L (ref 3.5–5.1)
Sodium: 138 mEq/L (ref 135–145)
TOTAL PROTEIN: 6.6 g/dL (ref 6.0–8.3)
Total Bilirubin: 0.9 mg/dL (ref 0.2–1.2)

## 2018-08-06 LAB — URINALYSIS, ROUTINE W REFLEX MICROSCOPIC
Bilirubin Urine: NEGATIVE
KETONES UR: NEGATIVE
Leukocytes, UA: NEGATIVE
Nitrite: NEGATIVE
PH: 6 (ref 5.0–8.0)
RBC / HPF: NONE SEEN (ref 0–?)
SPECIFIC GRAVITY, URINE: 1.01 (ref 1.000–1.030)
TOTAL PROTEIN, URINE-UPE24: NEGATIVE
URINE GLUCOSE: NEGATIVE
UROBILINOGEN UA: 0.2 (ref 0.0–1.0)
WBC, UA: NONE SEEN (ref 0–?)

## 2018-08-06 LAB — PSA: PSA: 2.45 ng/mL (ref 0.10–4.00)

## 2018-08-06 LAB — LIPID PANEL
CHOLESTEROL: 145 mg/dL (ref 0–200)
HDL: 63.2 mg/dL (ref >39.00)
LDL Cholesterol: 61 mg/dL (ref 0–99)
NONHDL: 81.44
Total CHOL/HDL Ratio: 2
Triglycerides: 100 mg/dL (ref 0.0–149.0)
VLDL: 20 mg/dL (ref 0.0–40.0)

## 2018-08-06 LAB — TSH: TSH: 1.88 u[IU]/mL (ref 0.35–4.50)

## 2018-08-06 LAB — HEMOGLOBIN A1C: Hgb A1c MFr Bld: 6.2 % (ref 4.6–6.5)

## 2018-08-06 NOTE — Patient Instructions (Signed)

## 2018-08-06 NOTE — Progress Notes (Signed)
Subjective:  Patient ID: Connor Johnson, male    DOB: [birth year]  Age: 77 y.o. MRN: [id number]  CC: Cough and Annual Exam   HPI Connor Johnson presents for a CPX.  His wife is here with him today and she complains about his declining memory and worsening forgetfulness.  The patient does not seem too concerned about this.  He complains of a 1 year history of chronic nonproductive cough with runny nose and postnasal drip.  He denies shortness of breath, hemoptysis, chest pain, DOE, palpitations, edema, or fatigue.  He complains that he bleeds easily when he bumps his skin and he wants to stop taking Plavix.  Past Medical History:  Diagnosis Date  . Arthritis   . Cataract   . COPD (chronic obstructive pulmonary disease) ([place])   . Diverticulitis   . Diverticulosis   . HLD (hyperlipidemia)   . Hypertension   . TIA (transient ischemic attack)    Past Surgical History:  Procedure Laterality Date  . PENILE PROSTHESIS IMPLANT      reports that he quit smoking about 22 years ago. His smoking use included cigarettes. He has never used smokeless tobacco. He reports that he drinks alcohol. He reports that he does not use drugs. family history includes Diabetes in his brother; Heart disease in his paternal grandmother; Liver cancer in his father; Prostate cancer in his brother. Allergies  Allergen Reactions  . Morphine And Related Other (See Comments)    Tremors and delirious Tremors and delirious  . Hctz [Hydrochlorothiazide] Rash    Outpatient Medications Prior to Visit  Medication Sig Dispense Refill  . EQ ASPIRIN ADULT LOW DOSE 81 MG EC tablet TAKE 1 TABLET BY MOUTH ONCE DAILY 90 tablet 1  . meloxicam (MOBIC) 15 MG tablet TAKE 1/2 (ONE-HALF) TABLET BY MOUTH ONCE DAILY 90 tablet 1  . rosuvastatin (CRESTOR) 20 MG tablet Take 0.5 tablets (10 mg total) by mouth daily. 45 tablet 1  . telmisartan (MICARDIS) 40 MG tablet Take 1 tablet (40 mg total) by mouth daily. 90 tablet 1  . clopidogrel  (PLAVIX) 75 MG tablet Take 1 tablet (75 mg total) by mouth daily. 90 tablet 1   No facility-administered medications prior to visit.     ROS Review of Systems  Constitutional: Positive for unexpected weight change (wt gain). Negative for appetite change, chills, diaphoresis, fatigue and fever.  HENT: Positive for congestion, postnasal drip and rhinorrhea. Negative for trouble swallowing.   Eyes: Negative for visual disturbance.  Respiratory: Positive for cough. Negative for chest tightness, shortness of breath and wheezing.   Cardiovascular: Negative for chest pain, palpitations and leg swelling.  Gastrointestinal: Negative.  Negative for abdominal pain, blood in stool, constipation, diarrhea, nausea and vomiting.  Endocrine: Negative.   Genitourinary: Negative.  Negative for difficulty urinating, scrotal swelling, testicular pain and urgency.  Musculoskeletal: Positive for arthralgias. Negative for myalgias.  Skin: Negative.  Negative for color change and rash.  Neurological: Negative.  Negative for dizziness, weakness and light-headedness.  Hematological: Negative for adenopathy. Does not bruise/bleed easily.  Psychiatric/Behavioral: Positive for confusion and decreased concentration. Negative for hallucinations, self-injury, sleep disturbance and suicidal ideas. The patient is not nervous/anxious.     Objective:  BP 140/88 (BP Location: Left Arm, Patient Position: Sitting, Cuff Size: Large)   Pulse 80   Temp 98 F (36.7 C) (Oral)   Resp 16   Ht 5' 6.5" (1.689 m)   Wt 205 lb 12 oz (93.3 kg)   SpO2 97%  BMI 32.71 kg/m   BP Readings from Last 3 Encounters:  08/06/18 140/88  02/21/18 140/80  07/17/17 130/60    Wt Readings from Last 3 Encounters:  08/06/18 205 lb 12 oz (93.3 kg)  02/21/18 203 lb (92.1 kg)  07/17/17 208 lb 5 oz (94.5 kg)    Physical Exam  Constitutional: He is oriented to person, place, and time. No distress.  HENT:  Mouth/Throat: Oropharynx is clear  and moist. No oropharyngeal exudate.  Eyes: Pupils are equal, round, and reactive to light. EOM are normal. No scleral icterus.  Neck: Normal range of motion. Neck supple. No JVD present. No thyromegaly present.  Cardiovascular: Normal rate, regular rhythm and normal heart sounds. Exam reveals no gallop.  No murmur heard. Pulmonary/Chest: Effort normal. No respiratory distress. He has no wheezes. He has rhonchi in the right lower field. He has no rales.  Inspiratory rhonchi in the right base.  Abdominal: Soft. Bowel sounds are normal. He exhibits no mass. There is no hepatosplenomegaly. There is no tenderness. Hernia confirmed negative in the right inguinal area and confirmed negative in the left inguinal area.  Genitourinary: Rectum normal, testes normal and penis normal. Rectal exam shows no external hemorrhoid, no internal hemorrhoid, no fissure, no mass, no tenderness, anal tone normal and guaiac negative stool. Prostate is enlarged (1+ smooth symm BPH). Prostate is not tender. Right testis shows no mass, no swelling and no tenderness. Left testis shows no mass, no swelling and no tenderness. Circumcised. No penile erythema or penile tenderness. No discharge found.  Musculoskeletal: Normal range of motion. He exhibits no edema, tenderness or deformity.  Lymphadenopathy:    He has no cervical adenopathy. No inguinal adenopathy noted on the right or left side.  Neurological: He is alert and oriented to person, place, and time.  Skin: Skin is warm and dry. No rash noted. He is not diaphoretic.  Vitals reviewed.   Lab Results  Component Value Date   WBC 5.8 07/17/2017   HGB 15.6 07/17/2017   HCT 46.6 07/17/2017   PLT 266.0 07/17/2017   GLUCOSE 121 (H) 08/06/2018   CHOL 145 08/06/2018   TRIG 100.0 08/06/2018   HDL 63.20 08/06/2018   LDLCALC 61 08/06/2018   ALT 13 08/06/2018   AST 14 08/06/2018   NA 138 08/06/2018   K 4.4 08/06/2018   CL 102 08/06/2018   CREATININE 0.99 08/06/2018    BUN 17 08/06/2018   CO2 28 08/06/2018   TSH 1.88 08/06/2018   PSA 2.45 08/06/2018   HGBA1C 6.2 08/06/2018    Ct Abdomen Pelvis W Contrast  Result Date: 09/01/2015 CLINICAL DATA:  Left lower quadrant pain for 3 days EXAM: CT ABDOMEN AND PELVIS WITH CONTRAST TECHNIQUE: Multidetector CT imaging of the abdomen and pelvis was performed using the standard protocol following bolus administration of intravenous contrast. CONTRAST:  157mL OMNIPAQUE IOHEXOL 300 MG/ML SOLN, 37mL OMNIPAQUE IOHEXOL 300 MG/ML SOLN COMPARISON:  None. FINDINGS: The lung bases are free of acute infiltrate or sizable effusion. The liver, gallbladder, spleen, left adrenal gland and pancreas are within normal limits with the exception of a small hepatic cyst. A small nodule is noted in the right adrenal gland best seen on image number 25 of series 2 measuring less than 1 cm and likely representing a small adenoma. Kidneys are well visualized bilaterally. No renal calculi or obstructive changes are noted. No abnormal enhancement is seen. The appendix is within normal limits. Diverticular change of the colon is seen with evidence  of diverticulitis in the sigmoid colon proximally. No micro perforation or abscess is identified at this time. The bladder is well distended. Changes of penile prosthesis are seen. No pelvic mass lesion is noted. Mild aortic calcifications are seen. Degenerative changes of the lumbar spine are noted. IMPRESSION: Diverticulitis within the sigmoid colon without evidence of abscess or perforation. Likely small right adrenal adenoma. Tiny cyst within the liver. Electronically Signed   By: Inez Catalina M.D.   On: 09/01/2015 13:44   Dg Chest 2 View  Result Date: 08/06/2018 CLINICAL DATA:  Cough, hypertension, former smoking history EXAM: CHEST - 2 VIEW COMPARISON:  None. FINDINGS: The lungs are somewhat hyperaerated with slightly flattened hemidiaphragms suggesting and element of emphysema. No pneumonia or effusion is  currently seen. Mediastinal and hilar contours are unremarkable and the heart is borderline enlarged. There are degenerative changes throughout the thoracic spine. Degenerative changes also noted in the shoulders. IMPRESSION: 1. Hyperaeration may indicate mild emphysematous change. 2. No pneumonia or effusion. Electronically Signed   By: Ivar Drape M.D.   On: 08/06/2018 12:31     Assessment & Plan:   Jaymz was seen today for cough and annual exam.  Diagnoses and all orders for this visit:  Routine medical exam  Need for influenza vaccination -     Flu vaccine HIGH DOSE PF (Fluzone High dose)  Essential hypertension- His blood pressure is adequately well controlled.  Electrolytes and renal function are normal. -     Comprehensive metabolic panel; Future -     TSH; Future -     Urinalysis, Routine w reflex microscopic; Future  Benign prostatic hyperplasia without lower urinary tract symptoms- His PSA is normal which is reassuring that he does not have prostate cancer.  He has no symptoms that need to be treated. -     PSA; Future  Prediabetes- His A1c is at 6.2%.  Medical therapy is not indicated. -     Hemoglobin A1c; Future  Hyperlipidemia with target LDL less than 70- He has achieved his LDL goal and is doing well on the statin. -     Lipid panel; Future -     TSH; Future  Memory impairment -     Ambulatory referral to Neurology  Cough- His chest x-ray shows hyperinflation but no mass or infiltrate.  His symptoms are consistent with chronic bronchitis.  He tells me the symptoms are not severe enough for him to require treatment. -     DG Chest 2 View; Future  Routine general medical examination at a health care facility   I have discontinued Connor Johnson's clopidogrel. I am also having him maintain his telmisartan, rosuvastatin, meloxicam, and EQ ASPIRIN ADULT LOW DOSE.  No orders of the defined types were placed in this encounter.  See AVS for instructions about healthy  living and anticipatory guidance.   Follow-up: Return in about 6 weeks (around 09/17/2018).  Scarlette Calico, MD

## 2018-08-09 NOTE — Assessment & Plan Note (Signed)

## 2018-08-13 ENCOUNTER — Other Ambulatory Visit: Payer: Self-pay | Admitting: Internal Medicine

## 2018-08-13 DIAGNOSIS — E785 Hyperlipidemia, unspecified: Secondary | ICD-10-CM

## 2018-08-13 DIAGNOSIS — G453 Amaurosis fugax: Secondary | ICD-10-CM

## 2018-08-13 MED ORDER — ROSUVASTATIN CALCIUM 20 MG PO TABS: 10.0000 mg | ORAL_TABLET | Freq: Every day | ORAL | 1 refills | Status: DC

## 2018-08-13 NOTE — Telephone Encounter (Signed)
Copied from [place] [phone]. Topic: Quick Communication - Rx Refill/Question >> Aug 13, 2018 10:12 AM Keene Breath wrote: Medication: rosuvastatin (CRESTOR) 20 MG tablet  Patient called to request a refill for the above medication  Preferred Pharmacy (with phone number or street name): [facility] [phone] (Phone) [phone] (Fax)

## 2018-09-17 ENCOUNTER — Encounter: Payer: Self-pay | Admitting: Internal Medicine

## 2018-09-17 ENCOUNTER — Ambulatory Visit (INDEPENDENT_AMBULATORY_CARE_PROVIDER_SITE_OTHER): Payer: Medicare Other | Admitting: Internal Medicine

## 2018-09-17 VITALS — BP 124/86 | HR 90 | Temp 98.7°F | Resp 16 | Ht 66.5 in | Wt 209.0 lb

## 2018-09-17 DIAGNOSIS — R7303 Prediabetes: Secondary | ICD-10-CM

## 2018-09-17 DIAGNOSIS — I10 Essential (primary) hypertension: Secondary | ICD-10-CM

## 2018-09-17 DIAGNOSIS — J439 Emphysema, unspecified: Secondary | ICD-10-CM | POA: Diagnosis not present

## 2018-09-17 NOTE — Progress Notes (Signed)
Subjective:  Patient ID: Connor Johnson, male    DOB: Jan 07, 1941  Age: 78 y.o. MRN: [id number]  CC: Hypertension   HPI Connor Johnson presents for f/up - He tells me his blood pressure has been well controlled.  He denies any recent episodes of CP or DOE.  He has rare nonproductive cough but denies wheezing.  He quit smoking about 24 years ago.  He recently had a chest x-ray that revealed mild emphysema.  Outpatient Medications Prior to Visit  Medication Sig Dispense Refill  . EQ ASPIRIN ADULT LOW DOSE 81 MG EC tablet TAKE 1 TABLET BY MOUTH ONCE DAILY 90 tablet 1  . meloxicam (MOBIC) 15 MG tablet TAKE 1/2 (ONE-HALF) TABLET BY MOUTH ONCE DAILY 90 tablet 1  . rosuvastatin (CRESTOR) 20 MG tablet Take 0.5 tablets (10 mg total) by mouth daily. 45 tablet 1  . telmisartan (MICARDIS) 40 MG tablet Take 1 tablet (40 mg total) by mouth daily. 90 tablet 1   No facility-administered medications prior to visit.     ROS Review of Systems  Constitutional: Negative for appetite change, diaphoresis, fatigue and unexpected weight change.  HENT: Negative.   Respiratory: Positive for cough. Negative for chest tightness, shortness of breath and wheezing.   Cardiovascular: Negative for chest pain, palpitations and leg swelling.  Gastrointestinal: Negative for abdominal pain, constipation, diarrhea, nausea and vomiting.  Genitourinary: Negative.  Negative for difficulty urinating.  Musculoskeletal: Negative.  Negative for arthralgias and myalgias.  Skin: Negative.  Negative for color change and pallor.  Neurological: Negative.  Negative for dizziness, weakness, light-headedness and headaches.  Hematological: Negative for adenopathy. Does not bruise/bleed easily.  Psychiatric/Behavioral: Negative.     Objective:  BP 124/86   Pulse 90   Temp 98.7 F (37.1 C) (Oral)   Resp 16   Ht 5' 6.5" (1.689 m)   Wt 209 lb (94.8 kg)   SpO2 97%   BMI 33.23 kg/m   BP Readings from Last 3 Encounters:  09/17/18 124/86    08/06/18 140/88  02/21/18 140/80    Wt Readings from Last 3 Encounters:  09/17/18 209 lb (94.8 kg)  08/06/18 205 lb 12 oz (93.3 kg)  02/21/18 203 lb (92.1 kg)    Physical Exam Vitals signs reviewed.  Constitutional:      Appearance: Normal appearance. He is normal weight.  HENT:     Nose: Nose normal. No congestion.     Mouth/Throat:     Mouth: Mucous membranes are moist.     Pharynx: No oropharyngeal exudate or posterior oropharyngeal erythema.  Eyes:     General: No scleral icterus.    Conjunctiva/sclera: Conjunctivae normal.  Neck:     Musculoskeletal: Normal range of motion. No muscular tenderness.  Cardiovascular:     Rate and Rhythm: Regular rhythm.     Heart sounds: No murmur. No gallop.   Pulmonary:     Effort: Pulmonary effort is normal.     Breath sounds: Normal breath sounds. No stridor. No wheezing, rhonchi or rales.  Abdominal:     General: Abdomen is flat. Bowel sounds are normal.     Palpations: There is no hepatomegaly, splenomegaly or mass.     Tenderness: There is no abdominal tenderness.     Hernia: No hernia is present.  Musculoskeletal: Normal range of motion.        General: No swelling.     Right lower leg: No edema.     Left lower leg: No edema.  Lymphadenopathy:  Cervical: No cervical adenopathy.  Skin:    General: Skin is warm and dry.     Coloration: Skin is not pale.     Findings: No rash.  Neurological:     General: No focal deficit present.     Mental Status: He is alert and oriented to person, place, and time. Mental status is at baseline.     Lab Results  Component Value Date   WBC 5.8 07/17/2017   HGB 15.6 07/17/2017   HCT 46.6 07/17/2017   PLT 266.0 07/17/2017   GLUCOSE 121 (H) 08/06/2018   CHOL 145 08/06/2018   TRIG 100.0 08/06/2018   HDL 63.20 08/06/2018   LDLCALC 61 08/06/2018   ALT 13 08/06/2018   AST 14 08/06/2018   NA 138 08/06/2018   K 4.4 08/06/2018   CL 102 08/06/2018   CREATININE 0.99 08/06/2018   BUN  17 08/06/2018   CO2 28 08/06/2018   TSH 1.88 08/06/2018   PSA 2.45 08/06/2018   HGBA1C 6.2 08/06/2018    Dg Chest 2 View  Result Date: 08/06/2018 CLINICAL DATA:  Cough, hypertension, former smoking history EXAM: CHEST - 2 VIEW COMPARISON:  None. FINDINGS: The lungs are somewhat hyperaerated with slightly flattened hemidiaphragms suggesting and element of emphysema. No pneumonia or effusion is currently seen. Mediastinal and hilar contours are unremarkable and the heart is borderline enlarged. There are degenerative changes throughout the thoracic spine. Degenerative changes also noted in the shoulders. IMPRESSION: 1. Hyperaeration may indicate mild emphysematous change. 2. No pneumonia or effusion. Electronically Signed   By: Ivar Drape M.D.   On: 08/06/2018 12:31    Assessment & Plan:   Connor Johnson was seen today for hypertension.  Diagnoses and all orders for this visit:  Essential hypertension- His blood pressure is well controlled.  Pulmonary emphysema, unspecified emphysema type ([place])- He does not have enough symptoms to want to treat this.  Prediabetes- His A1c is at 6.2%.  Medical therapy is not indicated.   I am having Connor Johnson maintain his telmisartan, meloxicam, EQ ASPIRIN ADULT LOW DOSE, and rosuvastatin.  No orders of the defined types were placed in this encounter.    Follow-up: Return in about 6 months (around 03/18/2019).  Connor Calico, MD

## 2018-09-17 NOTE — Patient Instructions (Signed)

## 2018-10-08 DIAGNOSIS — H04123 Dry eye syndrome of bilateral lacrimal glands: Secondary | ICD-10-CM | POA: Diagnosis not present

## 2018-10-08 DIAGNOSIS — H52203 Unspecified astigmatism, bilateral: Secondary | ICD-10-CM | POA: Diagnosis not present

## 2018-10-08 DIAGNOSIS — H02834 Dermatochalasis of left upper eyelid: Secondary | ICD-10-CM | POA: Diagnosis not present

## 2018-10-08 DIAGNOSIS — H11153 Pinguecula, bilateral: Secondary | ICD-10-CM | POA: Diagnosis not present

## 2018-10-08 DIAGNOSIS — H524 Presbyopia: Secondary | ICD-10-CM | POA: Diagnosis not present

## 2018-10-08 DIAGNOSIS — H2513 Age-related nuclear cataract, bilateral: Secondary | ICD-10-CM | POA: Diagnosis not present

## 2018-10-08 DIAGNOSIS — H35033 Hypertensive retinopathy, bilateral: Secondary | ICD-10-CM | POA: Diagnosis not present

## 2018-10-08 DIAGNOSIS — H43393 Other vitreous opacities, bilateral: Secondary | ICD-10-CM | POA: Diagnosis not present

## 2018-10-08 DIAGNOSIS — H5203 Hypermetropia, bilateral: Secondary | ICD-10-CM | POA: Diagnosis not present

## 2018-10-08 DIAGNOSIS — H02831 Dermatochalasis of right upper eyelid: Secondary | ICD-10-CM | POA: Diagnosis not present

## 2018-10-08 NOTE — Progress Notes (Signed)
[facility]    Provider:  Dr Jaynee Johnson Referring Provider: Janith Lima, MD Primary Care Provider:  Janith Lima, MD  CC:  Memory loss  HPI:  Connor Johnson is a 78 y.o. male here as requested by provider Connor Lima, MD for memory impairment.  Past medical history hypertension, TIA, emphysema or COPD, osteoarthritis, hyperlipidemia, prediabetes. Here with his wife who provides much information. Patient says he walks into a room and forgets why he is there. Wife says he is worse later in the day, more "sundowning", he doesn't remember things he did recently more short term memory. Forgetting recent conversations, repeats the same questions and stories in the same day, patient pays the bills on time, he manages his own medications but patient watches and monitors because he won;t remember to order the prescription, no money mangement issues, he knows the day, date, month and year, remembers anniversaries and birthdays but having more difficulty with children's birthdays, kids noticed his memory issues a little bit. His wife is a Marine scientist. He has hearing loss and just had new hearing aids. He has always had difficulty multi-tasking and that is worsening, he has worsening attention, he has a hobby of trains and still enjoys that, he is very creative still building but his :continuity" isn;t what it used to be, he doesn't like to travel anymore but no reduction socially, goes to the gym 3x a week. No changes in personality or mood but he does have less patience. Parents were alcoholics and so unclear if they had dementia but no hx of alzheimers. He is more sensitive and wife feels she can't say anything to him. No other focal neurologic deficits, associated symptoms, inciting events or modifiable factors.  Reviewed notes, labs and imaging from outside physicians, which showed: Reviewed notes from Connor. Ronnald Johnson who has been seeing him since 2017.  Connor. Ronnald Johnson has been treating him for  hypertension and hyperlipidemia.  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  Also treating him for prediabetes.  He had a TIA in 2015 or 2016 and was treated by a neurologist in [facility], described it as amaurosis fugax.  He has chronic arthritis and both feet.  He was maintained on aspirin and Plavix in the past but currently now just on low-dose aspirin.Marland Kitchen  He is very active and works out in [employer].  He quit smoking 24 years ago.  Last hemoglobin A1c was 6.3.  Review of Systems: Patient complains of symptoms per HPI as well as the following symptoms:snoring, easy bruising and bleeding, cough, snoring,joint pain, hearing loss, runny nose. Pertinent negatives and positives per HPI. All others negative.   Social History   Socioeconomic History  . Marital status: Married    Spouse name: Not on file  . Number of children: 4  . Years of education: 14.5  . Highest education level: Not on file  Occupational History  . Occupation: retired  Scientific laboratory technician  . Financial resource strain: Not hard at all  . Food insecurity:    Worry: Never true    Inability: Never true  . Transportation needs:    Medical: No    Non-medical: No  Tobacco Use  . Smoking status: Former Smoker    Packs/day: 1.50    Types: Cigarettes    Last attempt to quit: 09/16/1995    Years since quitting: 23.0  . Smokeless tobacco: Never Used  Substance and Sexual Activity  .  Alcohol use: Yes    Alcohol/week: 1.0 - 2.0 standard drinks    Types: 1 - 2 Glasses of wine per week  . Drug use: No  . Sexual activity: Yes  Lifestyle  . Physical activity:    Days per week: 2 days    Minutes per session: 150+ min  . Stress: Not at all  Relationships  . Social connections:    Talks on phone: More than three times a week    Gets together: More than three times a week    Attends religious service: Not on file    Active member of club or  organization: Not on file    Attends meetings of clubs or organizations: Not on file    Relationship status: Married  . Intimate partner violence:    Fear of current or ex partner: No    Emotionally abused: No    Physically abused: No    Forced sexual activity: No  Other Topics Concern  . Not on file  Social History Narrative   Lives at home with wife Connor Johnson   Right handed   Caffeine: 4-7 cups daily    Family History  Problem Relation Age of Onset  . Liver cancer Father   . Alcoholism Father   . Prostate cancer Brother   . Diabetes Brother   . Heart disease Paternal Grandmother   . Heart disease Mother   . Alcoholism Mother   . Colon cancer Neg Hx   . Esophageal cancer Neg Hx   . Rectal cancer Neg Hx   . Stomach cancer Neg Hx   . Dementia Neg Hx     Past Medical History:  Diagnosis Date  . Arthritis   . Cataract   . COPD (chronic obstructive pulmonary disease) ([place])   . Diverticulitis   . Diverticulosis   . HLD (hyperlipidemia)   . Hypertension   . TIA (transient ischemic attack) 2013   temporary loss of vision in L eye     Patient Active Problem List   Diagnosis Date Noted  . Pulmonary emphysema ([place]) 09/17/2018  . Routine general medical examination at a health care facility 07/19/2016  . Benign prostatic hyperplasia without lower urinary tract symptoms 07/19/2016  . Generalized OA 04/27/2016  . Prediabetes 10/18/2015  . Hyperlipidemia with target LDL less than 70 10/18/2015  . HTN (hypertension) 09/16/2015  . TIA (transient ischemic attack) 09/16/2015    Past Surgical History:  Procedure Laterality Date  . PENILE PROSTHESIS IMPLANT  07/30/2006   Connor. Fenton Johnson    Current Outpatient Medications  Medication Sig Dispense Refill  . EQ ASPIRIN ADULT LOW DOSE 81 MG EC tablet TAKE 1 TABLET BY MOUTH ONCE DAILY 90 tablet 1  . meloxicam (MOBIC) 15 MG tablet TAKE 1/2 (ONE-HALF) TABLET BY MOUTH ONCE DAILY 90 tablet 1  . rosuvastatin (CRESTOR) 20 MG tablet  Take 0.5 tablets (10 mg total) by mouth daily. 45 tablet 1  . telmisartan (MICARDIS) 40 MG tablet Take 1 tablet (40 mg total) by mouth daily. 90 tablet 1  . donepezil (ARICEPT) 10 MG tablet Take 1 tablet (10 mg total) by mouth at bedtime. 30 tablet 6   No current facility-administered medications for this visit.     Allergies as of 10/09/2018 - Review Complete 10/09/2018  Allergen Reaction Noted  . Morphine and related Other (See Comments) 09/01/2015  . Hctz [hydrochlorothiazide] Rash 09/16/2015    Vitals: BP 138/88 (BP Location: Left Arm, Patient Position: Sitting)   Pulse Marland Kitchen)  104   Ht 5\' 6"  (1.676 m)   Wt 207 lb (93.9 kg)   BMI 33.41 kg/m  Last Weight:  Wt Readings from Last 1 Encounters:  10/09/18 207 lb (93.9 kg)   Last Height:   Ht Readings from Last 1 Encounters:  10/09/18 5\' 6"  (1.676 m)     Physical exam: Exam: Gen: NAD, conversant, well nourised, obese, well groomed                     CV: RRR, no MRG. No Carotid Bruits. No peripheral edema, warm, nontender Eyes: Conjunctivae clear without exudates or hemorrhage  Neuro: Detailed Neurologic Exam  Speech:    Speech is normal; fluent and spontaneous with normal comprehension.  Cognition:  MMSE - Mini Mental State Exam 10/09/2018 07/31/2017  Orientation to time 5 5  Orientation to Place 5 5  Registration 3 3  Attention/ Calculation 3 5  Recall 3 2  Language- name 2 objects 2 2  Language- repeat 1 1  Language- follow 3 step command 3 3  Language- read & follow direction 1 1  Write a sentence 1 1  Copy design 1 1  Total score 28 29       The patient is oriented to person, place, and time;     recent and remote memory intact;     language fluent;     normal attention, concentration,     fund of knowledge Cranial Nerves:    The pupils are equal, round, and reactive to light.attempted fundoscopy could not visualize due to small pupils. . Visual fields are full to finger confrontation. Extraocular  movements are intact. Trigeminal sensation is intact and the muscles of mastication are normal. The face is symmetric. The palate elevates in the midline. Hearing intact. Voice is normal. Shoulder shrug is normal. The tongue has normal motion without fasciculations.   Coordination:    Normal finger to nose and heel to shin.   Gait:    Not ataxic  Motor Observation:    No asymmetry, no atrophy, and no involuntary movements noted. Tone:    Normal muscle tone.    Posture:    Posture is normal. normal erect    Strength:    Strength is V/V in the upper and lower limbs.      Sensation: intact to LT     Reflex Exam:  DTR's:    Deep tendon reflexes in the upper and lower extremities are normal bilaterally.   Toes:    The toes are downgoing bilaterally.   Clonus:    Clonus is absent.    Assessment/Plan:  78 y.o. male here as requested by provider Connor Lima, MD for memory impairment.  Past medical history hypertension, TIA, emphysema or COPD, osteoarthritis, hyperlipidemia, prediabetes. May be MCI but unlikely dementia, still needs thorough workup.   MRI of the brain w/wo contrast to evaluate for reversible causes of dementia Snores excessively: but no other symptoms of sleep apnea and not fatigued, ESS < 10 will monitor clinically Formal neurocognitive testing Can start Aricept  Labs today Conside FDG PET Scan for Dementia in the future   Orders Placed This Encounter  Procedures  . MR BRAIN W WO CONTRAST    Standing Status:   Future    Standing Expiration Date:   04/10/2019    Order Specific Question:   If indicated for the ordered procedure, I authorize the administration of contrast media per Radiology protocol    Answer:  Yes    Order Specific Question:   What is the patient's sedation requirement?    Answer:   No Sedation    Order Specific Question:   Does the patient have a pacemaker or implanted devices?    Answer:   No    Order Specific Question:   Radiology  Contrast Protocol - do NOT remove file path    Answer:   [email or web address]    Order Specific Question:   ** REASON FOR EXAM (FREE TEXT)    Answer:   cognitive changes    Order Specific Question:   Preferred imaging location?    Answer:   External  . B12 and Folate Panel  . Methylmalonic acid, serum  . Homocysteine  . RPR  . Vitamin B1  . Vitamin B6  . Basic Metabolic Panel  . Ambulatory referral to Neuropsychology    Referral Priority:   Routine    Referral Type:   Psychiatric    Referral Reason:   Specialty Services Required    Referred to Provider:   Edgardo Roys, PsyD    Requested Specialty:   Psychology    Number of Visits Requested:   1   Meds ordered this encounter  Medications  . donepezil (ARICEPT) 10 MG tablet    Sig: Take 1 tablet (10 mg total) by mouth at bedtime.    Dispense:  30 tablet    Refill:  6    Cc: Connor Lima, MD,    Sarina Ill, MD  [facility] [address] [place], [place] [address]  Phone [phone] Fax [phone]

## 2018-10-09 ENCOUNTER — Encounter: Payer: Self-pay | Admitting: Neurology

## 2018-10-09 ENCOUNTER — Ambulatory Visit (INDEPENDENT_AMBULATORY_CARE_PROVIDER_SITE_OTHER): Payer: Medicare Other | Admitting: Neurology

## 2018-10-09 VITALS — BP 138/88 | HR 104 | Ht 66.0 in | Wt 207.0 lb

## 2018-10-09 DIAGNOSIS — R4189 Other symptoms and signs involving cognitive functions and awareness: Secondary | ICD-10-CM | POA: Diagnosis not present

## 2018-10-09 DIAGNOSIS — E538 Deficiency of other specified B group vitamins: Secondary | ICD-10-CM

## 2018-10-09 DIAGNOSIS — E531 Pyridoxine deficiency: Secondary | ICD-10-CM

## 2018-10-09 DIAGNOSIS — R413 Other amnesia: Secondary | ICD-10-CM

## 2018-10-09 DIAGNOSIS — R410 Disorientation, unspecified: Secondary | ICD-10-CM | POA: Diagnosis not present

## 2018-10-09 DIAGNOSIS — G9340 Encephalopathy, unspecified: Secondary | ICD-10-CM | POA: Diagnosis not present

## 2018-10-09 DIAGNOSIS — E519 Thiamine deficiency, unspecified: Secondary | ICD-10-CM | POA: Diagnosis not present

## 2018-10-09 MED ORDER — DONEPEZIL HCL 10 MG PO TABS: 10.0000 mg | ORAL_TABLET | Freq: Every day | ORAL | 6 refills | Status: DC

## 2018-10-09 NOTE — Patient Instructions (Addendum)
MRI brain with and without contrast Labs today Formal Neurocognitive testing with Dr. Dion Saucier Conside FDG PET Scan for Dementia in the future Sleep apnea? Can cause memory loss Start Aricept  Donepezil tablets What is this medicine? DONEPEZIL (doe NEP e zil) is used to treat mild to moderate dementia caused by Alzheimer's disease. This medicine may be used for other purposes; ask your health care provider or pharmacist if you have questions. COMMON BRAND NAME(S): Aricept What should I tell my health care provider before I take this medicine? They need to know if you have any of these conditions: -asthma or other lung disease -difficulty passing urine -head injury -heart disease -history of irregular heartbeat -liver disease -seizures (convulsions) -stomach or intestinal disease, ulcers or stomach bleeding -an unusual or allergic reaction to donepezil, other medicines, foods, dyes, or preservatives -pregnant or trying to get pregnant -breast-feeding How should I use this medicine? Take this medicine by mouth with a glass of water. Follow the directions on the prescription label. You may take this medicine with or without food. Take this medicine at regular intervals. This medicine is usually taken before bedtime. Do not take it more often than directed. Continue to take your medicine even if you feel better. Do not stop taking except on your doctor's advice. If you are taking the 23 mg donepezil tablet, swallow it whole; do not cut, crush, or chew it. Talk to your pediatrician regarding the use of this medicine in children. Special care may be needed. Overdosage: If you think you have taken too much of this medicine contact a poison control center or emergency room at once. NOTE: This medicine is only for you. Do not share this medicine with others. What if I miss a dose? If you miss a dose, take it as soon as you can. If it is almost time for your next dose, take only that  dose, do not take double or extra doses. What may interact with this medicine? Do not take this medicine with any of the following medications: -certain medicines for fungal infections like itraconazole, fluconazole, posaconazole, and voriconazole -cisapride -dextromethorphan; quinidine -dofetilide -dronedarone -pimozide -quinidine -thioridazine -ziprasidone This medicine may also interact with the following medications: -antihistamines for allergy, cough and cold -atropine -bethanechol -carbamazepine -certain medicines for bladder problems like oxybutynin, tolterodine -certain medicines for Parkinson's disease like benztropine, trihexyphenidyl -certain medicines for stomach problems like dicyclomine, hyoscyamine -certain medicines for travel sickness like scopolamine -dexamethasone -ipratropium -NSAIDs, medicines for pain and inflammation, like ibuprofen or naproxen -other medicines for Alzheimer's disease -other medicines that prolong the QT interval (cause an abnormal heart rhythm) -phenobarbital -phenytoin -rifampin, rifabutin or rifapentine This list may not describe all possible interactions. Give your health care provider a list of all the medicines, herbs, non-prescription drugs, or dietary supplements you use. Also tell them if you smoke, drink alcohol, or use illegal drugs. Some items may interact with your medicine. What should I watch for while using this medicine? Visit your doctor or health care professional for regular checks on your progress. Check with your doctor or health care professional if your symptoms do not get better or if they get worse. You may get drowsy or dizzy. Do not drive, use machinery, or do anything that needs mental alertness until you know how this drug affects you. What side effects may I notice from receiving this medicine? Side effects that you should report to your doctor or health care professional as soon as possible: -allergic reactions  like  skin rash, itching or hives, swelling of the face, lips, or tongue -feeling faint or lightheaded, falls -loss of bladder control -seizures -signs and symptoms of a dangerous change in heartbeat or heart rhythm like chest pain; dizziness; fast or irregular heartbeat; palpitations; feeling faint or lightheaded, falls; breathing problems -signs and symptoms of infection like fever or chills; cough; sore throat; pain or trouble passing urine -signs and symptoms of liver injury like dark yellow or brown urine; general ill feeling or flu-like symptoms; light-colored stools; loss of appetite; nausea; right upper belly pain; unusually weak or tired; yellowing of the eyes or skin -slow heartbeat or palpitations -unusual bleeding or bruising -vomiting Side effects that usually do not require medical attention (report to your doctor or health care professional if they continue or are bothersome): -diarrhea, especially when starting treatment -headache -loss of appetite -muscle cramps -nausea -stomach upset This list may not describe all possible side effects. Call your doctor for medical advice about side effects. You may report side effects to FDA at 1-800-FDA-1088. Where should I keep my medicine? Keep out of reach of children. Store at room temperature between 15 and 30 degrees C (59 and 86 degrees F). Throw away any unused medicine after the expiration date. NOTE: This sheet is a summary. It may not cover all possible information. If you have questions about this medicine, talk to your doctor, pharmacist, or health care provider.  2019 Elsevier/Gold Standard (2016-02-14 21:00:42)  Memory Compensation Strategies  1. Use "WARM" strategy.  W= write it down  A= associate it  R= repeat it  M= make a mental note  2.   You can keep a Social worker.  Use a 3-ring notebook with sections for the following: calendar, important names and phone numbers,  medications, doctors' names/phone  numbers, lists/reminders, and a section to journal what you did  each day.   3.    Use a calendar to write appointments down.  4.    Write yourself a schedule for the day.  This can be placed on the calendar or in a separate section of the Memory Notebook.  Keeping a  regular schedule can help memory.  5.    Use medication organizer with sections for each day or morning/evening pills.  You may need help loading it  6.    Keep a basket, or pegboard by the door.  Place items that you need to take out with you in the basket or on the pegboard.  You may also want to  include a message board for reminders.  7.    Use sticky notes.  Place sticky notes with reminders in a place where the task is performed.  For example: " turn off the  stove" placed by the stove, "lock the door" placed on the door at eye level, " take your medications" on  the bathroom mirror or by the place where you normally take your medications.  8.    Use alarms/timers.  Use while cooking to remind yourself to check on food or as a reminder to take your medicine, or as a  reminder to make a call, or as a reminder to perform another task, etc.

## 2018-10-10 ENCOUNTER — Encounter: Payer: Self-pay | Admitting: Neurology

## 2018-10-10 ENCOUNTER — Telehealth: Payer: Self-pay | Admitting: Neurology

## 2018-10-10 DIAGNOSIS — M25561 Pain in right knee: Secondary | ICD-10-CM | POA: Diagnosis not present

## 2018-10-10 DIAGNOSIS — M1711 Unilateral primary osteoarthritis, right knee: Secondary | ICD-10-CM | POA: Diagnosis not present

## 2018-10-10 DIAGNOSIS — R4189 Other symptoms and signs involving cognitive functions and awareness: Secondary | ICD-10-CM | POA: Insufficient documentation

## 2018-10-10 NOTE — Telephone Encounter (Signed)
Medicare/BCBS supp order sent to GI lvm for pt to be aware. I left GI phone number of [phone] and to give them a call if he has not heard from them in the next 2-3 business days.

## 2018-10-13 LAB — B12 AND FOLATE PANEL
Folate: 5.4 ng/mL (ref >3.0)
Vitamin B-12: 303 pg/mL (ref 232–1245)

## 2018-10-13 LAB — BASIC METABOLIC PANEL
BUN / CREAT RATIO: 20 (ref 10–24)
BUN: 21 mg/dL (ref 8–27)
CO2: 22 mmol/L (ref 20–29)
Calcium: 9.4 mg/dL (ref 8.6–10.2)
Chloride: 100 mmol/L (ref 96–106)
Creatinine, Ser: 1.03 mg/dL (ref 0.76–1.27)
GFR calc Af Amer: 82 mL/min/{1.73_m2} (ref >59)
GFR calc non Af Amer: 71 mL/min/{1.73_m2} (ref >59)
Glucose: 103 mg/dL — ABNORMAL HIGH (ref 65–99)
Potassium: 4.7 mmol/L (ref 3.5–5.2)
Sodium: 138 mmol/L (ref 134–144)

## 2018-10-13 LAB — VITAMIN B1: THIAMINE: 144.5 nmol/L (ref 66.5–200.0)

## 2018-10-13 LAB — METHYLMALONIC ACID, SERUM: METHYLMALONIC ACID: 364 nmol/L (ref 0–378)

## 2018-10-13 LAB — RPR: RPR: NONREACTIVE

## 2018-10-13 LAB — HOMOCYSTEINE: HOMOCYSTEINE: 12.3 umol/L (ref 0.0–19.2)

## 2018-10-13 LAB — VITAMIN B6: Vitamin B6: 6.3 ug/L (ref 5.3–46.7)

## 2018-10-14 ENCOUNTER — Telehealth: Payer: Self-pay | Admitting: *Deleted

## 2018-10-14 NOTE — Telephone Encounter (Signed)
I spoke to the patient wife and informed her that per GI prodacal they have to check to make sure the device is MRI safe. She understand and stated she would give GI a call to check the status.

## 2018-10-14 NOTE — Telephone Encounter (Signed)
Pt's wife aware labs normal. She asked if the information about pt's implant was clarified yet for scheduling MRI.

## 2018-10-14 NOTE — Telephone Encounter (Signed)
-----   Message from Melvenia Beam, MD sent at 10/13/2018  9:52 PM EST ----- Labs normal thanks

## 2018-10-17 ENCOUNTER — Encounter: Payer: Self-pay | Admitting: Gastroenterology

## 2018-10-21 ENCOUNTER — Encounter: Payer: Self-pay | Admitting: Gastroenterology

## 2018-10-23 ENCOUNTER — Encounter: Payer: Self-pay | Admitting: Psychology

## 2018-10-27 ENCOUNTER — Other Ambulatory Visit: Payer: Self-pay | Admitting: Internal Medicine

## 2018-10-27 DIAGNOSIS — G459 Transient cerebral ischemic attack, unspecified: Secondary | ICD-10-CM

## 2018-10-28 ENCOUNTER — Telehealth: Payer: Self-pay | Admitting: Neurology

## 2018-10-28 NOTE — Telephone Encounter (Signed)
Returned Connor Johnson's call and LVM asking for call back. Left office number and hours in message.

## 2018-10-28 NOTE — Telephone Encounter (Signed)
Pt's wife Connor Johnson called back. She took him off the Aricept Saturday and he has been doing better since. I advised that Dr. Jaynee Eagles had also agreed to stop the Aricept. Connor Johnson stated pt was having bad dreams, intermittently lethargic, having temper tantrums. She stated his MRI is scheduled for this Thursday. He has a meet-n-greet scheduled next month with neuropsych and then testing potentially on May 14th. She stated the patient is anxious about it but she is trying to encourage him to keep the neuropsych appt. I reassured this testing will be very helpful and give [place] a good baseline for him if he is willing to proceed. She verbalized appreciation for the call.

## 2018-10-28 NOTE — Telephone Encounter (Signed)
Pt wife(on DPR) has called to inform the donepezil (ARICEPT) 10 MG tablet is not helping, pt having side effects to this medication.  Pt sleeping a lot, causing him to feel zombie like. Please call

## 2018-10-30 ENCOUNTER — Ambulatory Visit (AMBULATORY_SURGERY_CENTER): Payer: Self-pay

## 2018-10-30 ENCOUNTER — Other Ambulatory Visit: Payer: Self-pay | Admitting: Internal Medicine

## 2018-10-30 ENCOUNTER — Other Ambulatory Visit: Payer: Self-pay

## 2018-10-30 VITALS — Ht 66.0 in | Wt 210.4 lb

## 2018-10-30 DIAGNOSIS — G453 Amaurosis fugax: Secondary | ICD-10-CM

## 2018-10-30 DIAGNOSIS — I10 Essential (primary) hypertension: Secondary | ICD-10-CM

## 2018-10-30 DIAGNOSIS — Z86010 Personal history of colonic polyps: Secondary | ICD-10-CM

## 2018-10-30 MED ORDER — NA SULFATE-K SULFATE-MG SULF 17.5-3.13-1.6 GM/177ML PO SOLN: 1.0000 | Freq: Once | ORAL | 0 refills | Status: AC

## 2018-10-30 NOTE — Progress Notes (Signed)
No egg or soy allergy known to patient  No issues with past sedation with any surgeries  or procedures, no intubation problems  No diet pills per patient No home 02 use per patient  No blood thinners per patient  Pt denies issues with constipation  No A fib or A flutter  EMMI video sent to pt's e mail , pt declined    

## 2018-10-31 ENCOUNTER — Ambulatory Visit
Admission: RE | Admit: 2018-10-31 | Discharge: 2018-10-31 | Disposition: A | Discharge status: Home / Self Care | Payer: Medicare Other | Source: Ambulatory Visit | Attending: Neurology | Admitting: Neurology

## 2018-10-31 DIAGNOSIS — G9340 Encephalopathy, unspecified: Secondary | ICD-10-CM | POA: Diagnosis not present

## 2018-10-31 DIAGNOSIS — R410 Disorientation, unspecified: Secondary | ICD-10-CM | POA: Diagnosis not present

## 2018-10-31 DIAGNOSIS — R413 Other amnesia: Secondary | ICD-10-CM | POA: Diagnosis not present

## 2018-10-31 MED ORDER — GADOBENATE DIMEGLUMINE 529 MG/ML IV SOLN
19.0000 mL | Freq: Once | INTRAVENOUS | Status: AC | PRN
  Administered 2018-10-31: 19 mL via INTRAVENOUS

## 2018-11-04 ENCOUNTER — Telehealth: Payer: Self-pay | Admitting: Neurology

## 2018-11-04 NOTE — Telephone Encounter (Signed)
Spoke to patient and wife, they have neuropsych testing done in May.

## 2018-11-04 NOTE — Telephone Encounter (Signed)
I called patient to discuss MRI brain, left message to call us back. No significant progression since 2010 however there are findings of mild to moderate atrophy (can be seen in aging). He has advanced white matter changes likely reflecting chronic vascular damage from   hypertension, hyperlipidemia, prediabetes and his long history of smoking and although this can cause vascular dementia his has only slightly progressed since 2010, but still may be contributing to his cognitive changes. Next step is formal neurocognitive testing. thanks.   IMPRESSION: This MRI of the brain with and without contrast shows the following: 1.    Mild to moderate generalized cortical atrophy progressed compared to the 2010 MRI. 2.    White matter changes most consistent with moderately severe chronic microvascular ischemic change with large confluencies in the periatrial white matter.  This has just slightly progressed compared to the 2010 MRI. 3.    Left mastoid effusion, likely due to eustachian tube dysfunction

## 2018-11-20 ENCOUNTER — Ambulatory Visit (AMBULATORY_SURGERY_CENTER): Payer: Medicare Other | Admitting: Gastroenterology

## 2018-11-20 ENCOUNTER — Other Ambulatory Visit: Payer: Self-pay

## 2018-11-20 ENCOUNTER — Encounter: Payer: Self-pay | Admitting: Gastroenterology

## 2018-11-20 VITALS — BP 118/69 | HR 78 | Temp 99.1°F | Resp 13 | Ht 66.0 in | Wt 207.0 lb

## 2018-11-20 DIAGNOSIS — Z86010 Personal history of colonic polyps: Secondary | ICD-10-CM

## 2018-11-20 DIAGNOSIS — J449 Chronic obstructive pulmonary disease, unspecified: Secondary | ICD-10-CM | POA: Diagnosis not present

## 2018-11-20 DIAGNOSIS — D122 Benign neoplasm of ascending colon: Secondary | ICD-10-CM

## 2018-11-20 DIAGNOSIS — I10 Essential (primary) hypertension: Secondary | ICD-10-CM | POA: Diagnosis not present

## 2018-11-20 LAB — HM COLONOSCOPY

## 2018-11-20 MED ORDER — SODIUM CHLORIDE 0.9 % IV SOLN: 500.0000 mL | Freq: Once | INTRAVENOUS | Status: DC

## 2018-11-20 NOTE — Patient Instructions (Signed)
YOU HAD AN ENDOSCOPIC PROCEDURE TODAY AT THE [facility]:   Refer to the procedure report that was given to you for any specific questions about what was found during the examination.  If the procedure report does not answer your questions, please call your gastroenterologist to clarify.  If you requested that your care partner not be given the details of your procedure findings, then the procedure report has been included in a sealed envelope for you to review at your convenience later.  YOU SHOULD EXPECT: Some feelings of bloating in the abdomen. Passage of more gas than usual.  Walking can help get rid of the air that was put into your GI tract during the procedure and reduce the bloating. If you had a lower endoscopy (such as a colonoscopy or flexible sigmoidoscopy) you may notice spotting of blood in your stool or on the toilet paper. If you underwent a bowel prep for your procedure, you may not have a normal bowel movement for a few days.  Please Note:  You might notice some irritation and congestion in your nose or some drainage.  This is from the oxygen used during your procedure.  There is no need for concern and it should clear up in a day or so.  SYMPTOMS TO REPORT IMMEDIATELY:   Following lower endoscopy (colonoscopy or flexible sigmoidoscopy):  Excessive amounts of blood in the stool  Significant tenderness or worsening of abdominal pains  Swelling of the abdomen that is new, acute  Fever of 100F or higher  For urgent or emergent issues, a gastroenterologist can be reached at any hour by calling [phone].   DIET:  We do recommend a small meal at first, but then you may proceed to your regular diet.  Drink plenty of fluids but you should avoid alcoholic beverages for 24 hours.  ACTIVITY:  You should plan to take it easy for the rest of today and you should NOT DRIVE or use heavy machinery until tomorrow (because of the sedation medicines used during the test).     FOLLOW UP: Our staff will call the number listed on your records the next business day following your procedure to check on you and address any questions or concerns that you may have regarding the information given to you following your procedure. If we do not reach you, we will leave a message.  However, if you are feeling well and you are not experiencing any problems, there is no need to return our call.  We will assume that you have returned to your regular daily activities without incident.  If any biopsies were taken you will be contacted by phone or by letter within the next 1-3 weeks.  Please call us at [phone] if you have not heard about the biopsies in 3 weeks.   Await for biopsy results Polyps (handout given) Diverticulosis (handout given)  SIGNATURES/CONFIDENTIALITY: You and/or your care partner have signed paperwork which will be entered into your electronic medical record.  These signatures attest to the fact that that the information above on your After Visit Summary has been reviewed and is understood.  Full responsibility of the confidentiality of this discharge information lies with you and/or your care-partner. 

## 2018-11-20 NOTE — Progress Notes (Signed)
Pt's states no medical or surgical changes since previsit or office visit. 

## 2018-11-20 NOTE — Op Note (Signed)
[facility] Patient Name: Connor Johnson Procedure Date: 11/20/2018 12:36 PM MRN: [id number] Endoscopist: Remo Lipps P. Havery Moros , MD Age: 78 Referring MD:  Date of Birth: November 12, 1940 Gender: Male Account #: [id number] Procedure:                Colonoscopy Indications:              Surveillance: Personal history of adenomatous                            polyps on last colonoscopy 3 years ago (numerous) Medicines:                Monitored Anesthesia Care Procedure:                Pre-Anesthesia Assessment:                           - Prior to the procedure, a History and Physical                            was performed, and patient medications and                            allergies were reviewed. The patient's tolerance of                            previous anesthesia was also reviewed. The risks                            and benefits of the procedure and the sedation                            options and risks were discussed with the patient.                            All questions were answered, and informed consent                            was obtained. Prior Anticoagulants: The patient has                            taken no previous anticoagulant or antiplatelet                            agents. ASA Grade Assessment: II - A patient with                            mild systemic disease. After reviewing the risks                            and benefits, the patient was deemed in                            satisfactory condition to undergo the procedure.  After obtaining informed consent, the colonoscope                            was passed under direct vision. Throughout the                            procedure, the patient's blood pressure, pulse, and                            oxygen saturations were monitored continuously. The                            Colonoscope was introduced through the anus and                            advanced to the  the cecum, identified by                            appendiceal orifice and ileocecal valve. The                            colonoscopy was performed without difficulty. The                            patient tolerated the procedure well. The quality                            of the bowel preparation was fair. The ileocecal                            valve, appendiceal orifice, and rectum were                            photographed. Scope In: 12:39:12 PM Scope Out: 12:54:22 PM Scope Withdrawal Time: 0 hours 13 minutes 13 seconds  Total Procedure Duration: 0 hours 15 minutes 10 seconds  Findings:                 The perianal and digital rectal examinations were                            normal.                           A large amount of semi-liquid stool was found in                            the transverse colon and in the ascending colon,                            making visualization difficult. Lavage of the area                            was performed using copious amounts of sterile  water, resulting in incomplete clearance with fair                            visualization. Prep in the right colon was                            inadequate for screening purposes however no large                            polyps or mass lesions noted.                           Two sessile polyps were found in the ascending                            colon. The polyps were 2 to 3 mm in size. These                            polyps were removed with a cold biopsy forceps.                            Resection and retrieval were complete.                           Multiple medium-mouthed diverticula were found in                            the sigmoid colon, descending colon, transverse                            colon and ascending colon.                           Internal hemorrhoids were found during retroflexion.                           The exam was otherwise  without abnormality. Complications:            No immediate complications. Estimated blood loss:                            Minimal. Estimated Blood Loss:     Estimated blood loss was minimal. Impression:               - Preparation of the colon was fair in the                            transverse to right colon.                           - Two 2 to 3 mm polyps in the ascending colon,                            removed with a cold biopsy forceps. Resected and  retrieved.                           - Diverticulosis in the sigmoid colon, in the                            descending colon, in the transverse colon and in                            the ascending colon.                           - Internal hemorrhoids.                           - The examination was otherwise normal. Recommendation:           - Patient has a contact number available for                            emergencies. The signs and symptoms of potential                            delayed complications were discussed with the                            patient. Return to normal activities tomorrow.                            Written discharge instructions were provided to the                            patient.                           - Resume previous diet.                           - Continue present medications.                           - Await pathology results.                           - Will discuss options for future exam with the                            patient given bowel prep, would recommend repeat                            colonoscopy with double prep within the necxt year                            if patient is willing Steven P. Armbruster, MD 11/20/2018 12:59:19 PM This report has been signed electronically.

## 2018-11-20 NOTE — Progress Notes (Signed)
Report to PACU, RN, vss, BBS= Clear.  

## 2018-11-20 NOTE — Progress Notes (Signed)
Called to room to assist during endoscopic procedure.  Patient ID and intended procedure confirmed with present staff. Received instructions for my participation in the procedure from the performing physician.  

## 2018-11-21 ENCOUNTER — Telehealth: Payer: Self-pay | Admitting: *Deleted

## 2018-11-21 NOTE — Telephone Encounter (Signed)
  Follow up Call-  Call back number 11/20/2018  Post procedure Call Back phone  # [phone]  Permission to leave phone message Yes  Some recent data might be hidden    [facility]

## 2018-11-21 NOTE — Telephone Encounter (Signed)
No answer, message left for the patient. 

## 2018-11-26 ENCOUNTER — Encounter: Payer: Self-pay | Admitting: Gastroenterology

## 2018-12-05 DIAGNOSIS — M1711 Unilateral primary osteoarthritis, right knee: Secondary | ICD-10-CM | POA: Diagnosis not present

## 2018-12-18 DIAGNOSIS — M1711 Unilateral primary osteoarthritis, right knee: Secondary | ICD-10-CM | POA: Diagnosis not present

## 2019-01-01 DIAGNOSIS — M1711 Unilateral primary osteoarthritis, right knee: Secondary | ICD-10-CM | POA: Diagnosis not present

## 2019-01-08 DIAGNOSIS — M1711 Unilateral primary osteoarthritis, right knee: Secondary | ICD-10-CM | POA: Diagnosis not present

## 2019-01-15 ENCOUNTER — Other Ambulatory Visit: Payer: Self-pay | Admitting: Internal Medicine

## 2019-01-15 DIAGNOSIS — M159 Polyosteoarthritis, unspecified: Secondary | ICD-10-CM

## 2019-01-17 ENCOUNTER — Encounter: Payer: Medicare Other | Attending: Psychology | Admitting: Psychology

## 2019-01-17 ENCOUNTER — Other Ambulatory Visit: Payer: Self-pay

## 2019-01-17 DIAGNOSIS — R413 Other amnesia: Secondary | ICD-10-CM | POA: Diagnosis not present

## 2019-01-17 DIAGNOSIS — R4189 Other symptoms and signs involving cognitive functions and awareness: Secondary | ICD-10-CM

## 2019-02-17 ENCOUNTER — Encounter: Payer: Medicare Other | Attending: Psychology | Admitting: Psychology

## 2019-02-17 ENCOUNTER — Encounter: Payer: Self-pay | Admitting: Psychology

## 2019-02-17 ENCOUNTER — Other Ambulatory Visit: Payer: Self-pay

## 2019-02-17 DIAGNOSIS — R413 Other amnesia: Secondary | ICD-10-CM | POA: Insufficient documentation

## 2019-02-17 DIAGNOSIS — R4189 Other symptoms and signs involving cognitive functions and awareness: Secondary | ICD-10-CM | POA: Insufficient documentation

## 2019-02-17 NOTE — Progress Notes (Signed)
The patient arrived on time to his 13:00 testing appointment. The evaluation lasted 180 minutes.  Behavioral Observations:  Appearance: Casually and appropriately dressed/groomed.  Gait: Ambulated independently without difficulty Speech: Clear, normal rate, tone, & volume Thought process: Logical & linear Affect: Euthymic, appropriate   Interpersonal: Polite and appropriate  Orientation: Oriented x 4 Effort/Motivation: Excellent   Patient did not any difficulty hearing or understanding test instructions and did not require additional prompting. He exhibited good frustration tolerance on questions he did not know or tasks that were more difficult. He persisted well overall.    Tests Administered:  Wechsler Adult Intelligence Scale-Third Edition (WAIS-IV)  Wechsler Memory Scale-Fourth Edition (WMS-IV) Older Adult Battery (ages 57-90)  Results of the WAIS-IV:  Composite Score Summary  Scale Sum of Scaled Scores Composite Score Percentile Rank 95% Conf. Interval Qualitative Description  Verbal Comprehension 39 VCI 116 86 110-121 High Average  Perceptual Reasoning 42 PRI 123 94 116-128 Superior  Working Memory 17 WMI 92 30 86-99 Average  Processing Speed 23 PSI 108 70 99-116 Average  Full Scale 121 FSIQ 114 82 110-118 High Average  General Ability 81 GAI 123 94 117-127 Superior   Index Level Discrepancy Comparisons  Comparison Score 1 Score 2 Difference Critical Value .05 Significant Difference Y/N Base Rate by Overall Sample  VCI - PRI 116 123 -7 9.29 N 31.8  VCI - WMI 116 92 24 8.81 Y 3.0  VCI - PSI 116 108 8 10.18 N 31.5  PRI - WMI 123 92 31 9.74 Y 1.3  PRI - PSI 123 108 15 10.99 Y 16.8  WMI - PSI 92 108 -16 10.59 Y 13.5  FSIQ - GAI 114 123 -9 3.34 Y 3.6   Differences Between Subtest and Overall Mean of Subtest Scores  Subtest Subtest Scaled Score Mean Scaled Score Difference Critical Value .05 Strength or Weakness Base Rate  Block Design 13 12.10 0.90 2.85  >25%   Similarities 12 12.10 -0.10 2.82  >25%  Digit Span 10 12.10 -2.10 2.22  >25%  Matrix Reasoning 12 12.10 -0.10 2.54  >25%  Vocabulary 14 12.10 1.90 2.03  >25%  Arithmetic 7 12.10 -5.10 2.73 W <1%  Symbol Search 11 12.10 -1.10 3.42  >25%  Visual Puzzles 17 12.10 4.90 2.71 S 2-5%  Information 13 12.10 0.90 2.19  >25%  Coding 12 12.10 -0.10 2.97  >25%   Working Counsellor Score Summary  Process Score Raw Score Scaled Score Percentile Rank Base Rate SEM  Digit Span Forward 11 12 75 -- 1.31  Digit Span Backward 5 6 9  -- 1.34  Digit Span Sequencing 8 11 63 -- 1.12  Longest Digit Span Forward 7 -- -- 41.0 --  Longest Digit Span Backward 3 -- -- 95.0 --  Longest Digit Span Sequence 7 -- -- 8.0 --    Results of the WMS-IV (Older Adult Battery):  Brief Cognitive Status Exam Classification  Age Years of Education Raw Score Classification Level Base Rate  78 years 0 months 14 48 Low Average 26.3    Index Score Summary  Index Sum of Scaled Scores Index Score Percentile Rank 95% Confidence Interval Qualitative Descriptor  Auditory Memory (AMI) 41 101 53 95-107 Average  Visual Memory (VMI) 19 98 45 93-103 Average  Immediate Memory (IMI) 31 102 55 96-108 Average  Delayed Memory (DMI) 29 98 45 91-106 Average   Primary Subtest Scaled Score Summary  Subtest Domain Raw Score Scaled Score Percentile Rank  Logical Memory I AM 36  12 75  Logical Memory II AM 14 9 37  Verbal Paired Associates I AM 19 10 50  Verbal [employer] II AM 6 10 50  Visual Reproduction I VM 26 9 37  Visual Reproduction II VM 16 10 50  Symbol Span VWM 13 9 37   Auditory Memory Process Score Summary  Process Score Raw Score Scaled Score Percentile Rank Cumulative Percentage (Base Rate)  LM II Recognition 20 - - >75%  VPA II Recognition 29 - - >75%   Visual Memory Process Score Summary  Process Score Raw Score Scaled Score Percentile Rank Cumulative Percentage (Base Rate)  VR II Recognition 7 - - >75%

## 2019-03-13 ENCOUNTER — Encounter: Payer: Medicare Other | Attending: Psychology | Admitting: Psychology

## 2019-03-13 ENCOUNTER — Encounter: Payer: Self-pay | Admitting: Psychology

## 2019-03-13 ENCOUNTER — Other Ambulatory Visit: Payer: Self-pay

## 2019-03-13 DIAGNOSIS — R4189 Other symptoms and signs involving cognitive functions and awareness: Secondary | ICD-10-CM | POA: Diagnosis not present

## 2019-03-13 DIAGNOSIS — R413 Other amnesia: Secondary | ICD-10-CM | POA: Insufficient documentation

## 2019-03-13 DIAGNOSIS — I679 Cerebrovascular disease, unspecified: Secondary | ICD-10-CM | POA: Diagnosis not present

## 2019-03-13 NOTE — Progress Notes (Addendum)
Patient:  Connor Johnson   DOB: 06-17-1941  MR Number: [id number]  Location: [facility] PHYSICAL MEDICINE AND REHABILITATION [place], [place] [id number] [place] [id number] Dept: [phone]  Start: 11 AM End: 12 PM  Provider/Observer:     Edgardo Roys PsyD  Chief Complaint:      Chief Complaint  Patient presents with  . Memory Loss    Reason For Service:     Connor Johnson is a 78 year old male who was referred by Dr. Jaynee Eagles for neuropsychological evaluation due to patient reporting increasing memory difficulties and wife stating that they tend to be worse in the evenings.  The patient reports that he has increasing difficulty remembering names or experiences when he goes into a room to get something forgetting what he went in for exactly.   The patient tends to forget recent conversations and with the spouse will repeat the same questions and tell the same stories in the same day.  The patient has been able to self manage his medications and has had no money management issues and is able to show good orientation.  Testing Administered:  The patient was administered the Wechsler Adult Intelligence Scale-IV as well as the Wechsler Memory Scale-IV.  These test batteries were administered by Dr. Darol Destine and I will include his behavioral observations during this objective administration procedures below.  Behavioral Observations: Appearance:Casually and appropriately dressed/groomed.  Gait:Ambulated independently without difficulty Speech:Clear, normal rate, tone, & volume Thought process:Logical & linear Affect:Euthymic, appropriate   Interpersonal: Polite and appropriate  Orientation: Oriented x 4 Effort/Motivation: Excellent   Patient did not any difficulty hearing or understanding test instructions and did not require additional prompting. He exhibited good frustration tolerance on questions he did not  know or tasks that were more difficult. He persisted well overall.    Test Results:   Initially, the patient was administered the Wechsler Adult Intelligence Scale-IV.  The patient appeared to fully participate in this does appear to be a fair and valid estimation of his current overall cognitive functioning.   Composite Score Summary   Scale Sum of Scaled Scores Composite Score Percentile Rank 95% Conf. Interval Qualitative Description  Verbal Comprehension 39 VCI 116 86 110-121 High Average  Perceptual Reasoning 42 PRI 123 94 116-128 Superior  Working Memory 17 WMI 92 30 86-99 Average  Processing Speed 23 PSI 108 70 99-116 Average  Full Scale 121 FSIQ 114 82 110-118 High Average  General Ability 81 GAI 123 94 117-127 Superior   The patient produced a full scale IQ score of 114 which falls at the 82nd percentile and is in the high average range.  When making comparisons to predictions of his likely pre-existing global cognitive functions utilizing his occupational and educational history there does appear to be consistency between current achieved functioning as well as estimations of where he should be.  To further analyze global cognitive functioning with less emphasis on measures of working memory/encoding and information processing speed, both of which can be acutely affected with neurological issues we derived his general abilities index score.  The patient produced a general abilities index score of 123 which falls at the 94th percentile and is in the superior range.  Clearly, the patient is doing quite well with report card to his verbal skills and visual-spatial skills and there is only some mild relative difficulties with regard to encoding/working memory and overall speed of mental operations.  The patient produced  a verbal comprehension index score of 116 which falls at the 86 percentile and is in the high average range.  Individual subtest making up this measure did not suggest any  areas of particular difficulty and they all were in the average to high average range of functioning.  The patient produced a perceptual reasoning index score of 123 which falls at the 94th percentile and is in the superior range.  The patient did very well on measures of visual-spatial, visual analysis and organization and visual reasoning and problem-solving abilities.  The patient was exceptionally skilled in making visual assessments and visual predictions.  The patient produced a working memory index score of 92 which falls at the 30th percentile and is in the average range.  This is the patient's 1 area of relative weakness and he did show some significant difficulties with regard to auditory encoding and attentional aspects and abilities.  However, his working memory/encoding still fell within the average range of his normative population with one individual subtest score being in the mild to moderate range of impairment.  The patient produced a processing speed index score of 108 which falls at the [facility] and is in the average range.  All measures making up this composite score were in the average to high average range of overall functioning showing no significant deficits with regard to visual scanning, visual searching or overall speed of mental operations.  The patient did well on focus execute abilities.         Index Level Discrepancy Comparisons   Comparison Score 1 Score 2 Difference Critical Value .05 Significant Difference Y/N Base Rate by Overall Sample  VCI - PRI 116 123 -7 9.29 N 31.8  VCI - WMI 116 92 24 8.81 Y 3.0  VCI - PSI 116 108 8 10.18 N 31.5  PRI - WMI 123 92 31 9.74 Y 1.3  PRI - PSI 123 108 15 10.99 Y 16.8  WMI - PSI 92 108 -16 10.59 Y 13.5  FSIQ - GAI 114 123 -9 3.34 Y 3.6           Differences Between Subtest and Overall Mean of Subtest Scores   Subtest Subtest Scaled Score Mean Scaled Score Difference Critical Value .05 Strength or Weakness Base Rate   Block Design 13 12.10 0.90 2.85  >25%  Similarities 12 12.10 -0.10 2.82  >25%  Digit Span 10 12.10 -2.10 2.22  >25%  Matrix Reasoning 12 12.10 -0.10 2.54  >25%  Vocabulary 14 12.10 1.90 2.03  >25%  Arithmetic 7 12.10 -5.10 2.73 W <1%  Symbol Search 11 12.10 -1.10 3.42  >25%  Visual Puzzles 17 12.10 4.90 2.71 S 2-5%  Information 13 12.10 0.90 2.19  >25%  Coding 12 12.10 -0.10 2.97  >25%   As far as individual subtest the patient did show a significant weakness on his arithmetic subtest, which is more heavily affected by attention and concentration and mathematical abilities.  Considering the fact that the patient has quite a history of being quite good in science and math these deficits are not likely related to loss and mathematical abilities but are likely clearly related to difficulties with multi processing abilities and attention/concentration deficits.  To look at these encoding abilities more deeply we broke down the patient is working memory index score by subtest in comparison of subtest.  The patient showed good digit span forwards which is straight encoding abilities.  However, on digit span backwards as well as his arithmetic subtest  the patient showed significant deterioration in performance suggesting significant problems with multi processing abilities.  The patient was more distractible and as the cognitive demands went up he showed significant reduction in encoding ability.         Working Counsellor Score Summary   Process Score Raw Score Scaled Score Percentile Rank Base Rate SEM  Digit Span Forward 11 12 75 -- 1.31  Digit Span Backward 5 6 9  -- 1.34  Digit Span Sequencing 8 11 63 -- 1.12  Longest Digit Span Forward 7 -- -- 41.0 --  Longest Digit Span Backward 3 -- -- 95.0 --  Longest Digit Span Sequence 7 -- -- 8.0 --    Results of the WMS-IV (Older Adult Battery):        Brief Cognitive Status Exam Classification   Age Years of Education Raw  Score Classification Level Base Rate  78 years 0 months 14 48 Low Average 26.3   The patient was then administered the Wechsler Memory Scale-IV to assess for objective symptoms and findings of memory deficits.  The patient's primary encoding of visual and auditory information were in the average range but the patient did show some difficulties with multi processing aspects of encoding.  However, there was no difference between primary encoding of auditory or visual information.  Given the patient's global abilities index score of 123 I would expect that the patient would have high average memory functions across the board and both auditory and visual memory task.  The patient produced an immediate memory index score of 102 which falls at the 55th percentile and is in the average range.  While comparing his performance to his age-matched normative group he is in the average range there is a 15-20 point decrease in his immediate memory relative to predictions based on his global abilities index score.  The patient produced a delayed memory index score of 98 which falls at the 45th percentile and is also in the average range.  While there were no indications of significant or severe deficits with regard to delayed memory it is statistically below where his memory and function should be at given his other cognitive variables.  Breaking down auditory versus visual memory the patient produced an auditory memory index score of 101 which falls at the 53rd percentile and is in the average range.  This is very consistent with visual memory index score of 98 which falls at the 45th percentile and is also in the average range.  There does not appear to be any indications of lateralization's or difficulty with auditory versus visual memory functions.         Index Score Summary   Index Sum of Scaled Scores Index Score Percentile Rank 95% Confidence Interval Qualitative Descriptor  Auditory Memory (AMI) 41 101 53  95-107 Average  Visual Memory (VMI) 19 98 45 93-103 Average  Immediate Memory (IMI) 31 102 55 96-108 Average  Delayed Memory (DMI) 29 98 45 91-106 Average         Primary Subtest Scaled Score Summary   Subtest Domain Raw Score Scaled Score Percentile Rank  Logical Memory I AM 36 12 75  Logical Memory II AM 14 9 37  Verbal Paired Associates I AM 19 10 50  Verbal Paired Associates II AM 6 10 50  Visual Reproduction I VM 26 9 37  Visual Reproduction II VM 16 10 50  Symbol Span VWM 13 9 37         Auditory Memory  Process Score Summary   Process Score Raw Score Scaled Score Percentile Rank Cumulative Percentage (Base Rate)  LM II Recognition 20 - - >75%  VPA II Recognition 29 - - >75%         Visual Memory Process Score Summary   Process Score Raw Score Scaled Score Percentile Rank Cumulative Percentage (Base Rate)  VR II Recognition 7 - - >75%    Summary of Results:   Overall, the results of the current neuropsychological evaluation do not suggest any significant or severe neurocognitive deficits.  There is certainly no indications of degenerative dementia such as Alzheimer's, Lewy body or other degenerative cortical dementias.  The patient's only areas of difficulties were relative to his own exceptional premorbid/historically excellent cognitive functioning.  The patient produced full scale IQ scores and general abilities index scores in the high average to superior range with particular skills for visual spatial abilities and verbal skills including general fund of information, verbal reasoning and problem-solving, and comprehension and social judgment.  The patient has excellent perceptual reasoning abilities are consistent with his experience and his educational patterns as well as his occupational history.  The patient's memory was below predicted levels but still fell in the average range relative to his normative comparisons.  The one area that tended to stand out had to do with  certain aspects of attention and concentration.  This is found in multi processing abilities and more complex encoding abilities both visual and auditorily.  The impression of this pattern is again not consistent with cortically mediated degenerative dementias but is consistent with findings of his recent MRI and comparisons to previous imaging studies.  The patient's small vessel disease and changes in white matter are likely the primary culprit for his current mild cognitive difficulties particularly around encoding abilities and its negative impact on the effectiveness and efficiency of his memory and learning.  Given the patient's likely prior excellent cognitive functioning it is more noticeable to the patient and his wife as in general even the areas that he is having difficulty with are still in the average range relative to a normative comparison group.  It will be very important for the patient going forward to pay particular attention to his cardiovascular health and cerebrovascular health.  Good diet, physical activity, good sleep and monitoring issues such as his high blood pressure and other cardiovascular variables will be very important.  The patient is already showing some changes in his small vessel disease and white matter structures that would go beyond simple age-related changes.  However, as I stated before this does not appear to be a condition consistent with factor such as Alzheimer's or Lewy body dementia.  If the patient continues to see progressive changes it would be warranted to do repeat testing in approximately 9 months to assess for further deterioration now that we have a good baseline measure.  Diagnosis: The patient's diagnostic considerations should primarily be related to his cerebrovascular small vessel disease and the effects on white matter tracts that are impacting primarily attention and encoding variables.  It is likely that this attention, encoding, multi  processing changes are having the primary impact on his memory and learning difficulties.  It is not as much and inability to learn, store, organize new information it is more in aspect of breaking down with his attentional abilities.    Ilean Skill, Psy.D. Neuropsychologist

## 2019-03-13 NOTE — Progress Notes (Signed)
Neuropsychological Consultation   Patient:   Connor Johnson   DOB:   January 07, 1941  MR Number:  [id number]  Location:  [place] PHYSICAL MEDICINE AND REHABILITATION [place], [place] [id number] [place] [id number] Dept: [phone]           Date of Service:   01/17/2019   Start Time:   10 AM End Time:   12 PM  Provider/Observer:  Ilean Skill, Psy.D.       Clinical Neuropsychologist       Billing Code/Service: 5374 6/9 6121  Chief Complaint:    Ikaika Showers is a 78 year old male who was referred by Dr. Jaynee Eagles for neuropsychological evaluation due to patient reporting increasing memory difficulties and wife stating that they tend to be worse in the evenings.  The patient reports that he has increasing difficulty remembering names or experiences when he goes into a room to get something forgetting what he went in for exactly.   Today's visit was an in person visit and consisted of a 1 hour face-to-face clinical interview with the patient as well as 1 hour spent with medical records review, formal report writing, and planning and organizing formal testing strategies.  The patient tends to forget recent conversations and with the spouse will repeat the same questions and tell the same stories in the same day.  The patient has been able to self manage his medications and has had no money management issues and is able to show good orientation.   Reason for Service:  Tadhg Eskew is a 78 year old male who was referred by Dr. Jaynee Eagles for neuropsychological evaluation due to patient reporting increasing memory difficulties and wife stating that they tend to be worse in the evenings.  The patient reports that he has increasing difficulty remembering names or experiences when he goes into a room to get something forgetting what he went in for exactly.   Today's visit was an in person visit and consisted of a 1 hour face-to-face  clinical interview with the patient as well as 1 hour spent with medical records review, formal report writing, and planning and organizing formal testing strategies.  The patient tends to forget recent conversations and with the spouse will repeat the same questions and tell the same stories in the same day.  The patient has been able to self manage his medications and has had no money management issues and is able to show good orientation.  The patient has had a recent MRI showing mild to moderate generalized cortical atrophy that has progressed from an MRI that was conducted on 2010.  There also white matter changes identified most consistent with moderate to severe chronic microvascular ischemic changes with large confluency's in the parietal white matter.  The patient also reports that approximately 9 to 10 years ago he had a transient ischemic attack in his eye and lost vision in his left eye for about 15 minutes during this event.  Studies have suggested that he is suffered no permanent damage to his eye and his vision cleared up after the event.  The patient denies any further acute vascular events.  The patient reports that his wife started noticing changes in telling him he was having changes about 1-1/2 years ago and that when he has difficulty remembering something or having problems he does not always remember it and others do tell him that he does not realize the level of difficulties and cognitive problems that  he is having.  Current Status:  The patient describes memory changes particularly with short-term memory.  There is a description of a worsening in the evening.  Reliability of Information: Information is derived from 1 hour face-to-face clinical interview with the patient as well as review of available medical records.  Behavioral Observation: Baran Kuhrt  presents as a 78 y.o.-year-old Right Caucasian Male who appeared his stated age. his dress was Appropriate and he was Well  Groomed and his manners were Appropriate to the situation.  his participation was indicative of Appropriate and Redirectable behaviors.  There were not any physical disabilities noted.  he displayed an appropriate level of cooperation and motivation.     Interactions:    Active Appropriate and Redirectable  Attention:   abnormal and attention span appeared shorter than expected for age  Memory:   abnormal; remote memory intact, recent memory impaired  Visuo-spatial:  not examined  Speech (Volume):  normal  Speech:   normal; normal  Thought Process:  Coherent and Relevant  Though Content:  WNL; not suicidal and not homicidal  Orientation:   person, place, time/date and situation  Judgment:   Fair  Planning:   Fair  Affect:    Appropriate  Mood:    Euthymic  Insight:   Fair  Intelligence:   normal  Marital Status/Living: The patient was born in [place] and has 2 siblings.  The patient is married and been married for 28 years.  The patient has 3 adult children age 34, 81, and 13.  Current Employment: The patient is retired.  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  Hobbies and interests include boating, gardening and model trains.  Substance Use:  No concerns of substance abuse are reported.    Education:   The patient completed high school and had 2 years of formal education at the [employer].  His best subject in school was always science and he did have some difficulty with subjects such as history.  Extracurricular activities included baseball, track, and sea explorers.  Medical History:   Past Medical History:  Diagnosis Date  . Arthritis   . Cataract   . COPD (chronic obstructive pulmonary disease) ([place])   . Diverticulitis   . Diverticulosis   . HLD (hyperlipidemia)   . Hypertension   . TIA (transient ischemic attack) 2013   temporary loss of vision in L eye         Psychiatric  History:  The patient denies any psychiatric history.  Family Med/Psych History:  Family History  Problem Relation Age of Onset  . Liver cancer Father   . Alcoholism Father   . Prostate cancer Brother   . Diabetes Brother   . Heart disease Paternal Grandmother   . Heart disease Mother   . Alcoholism Mother   . Colon cancer Neg Hx   . Esophageal cancer Neg Hx   . Rectal cancer Neg Hx   . Stomach cancer Neg Hx   . Dementia Neg Hx     Risk of Suicide/Violence: virtually non-existent the patient denies any suicidal or homicidal ideation.  Impression/DX:  Romie Tay is a 78 year old male who was referred by Dr. Jaynee Eagles for neuropsychological evaluation due to patient reporting increasing memory difficulties and wife stating that they tend to be worse in the evenings.  The patient reports that he has increasing difficulty remembering names or experiences when he goes into a room to get  something forgetting what he went in for exactly.   Today's visit was an in person visit and consisted of a 1 hour face-to-face clinical interview with the patient as well as 1 hour spent with medical records review, formal report writing, and planning and organizing formal testing strategies.  The patient tends to forget recent conversations and with the spouse will repeat the same questions and tell the same stories in the same day.  The patient has been able to self manage his medications and has had no money management issues and is able to show good orientation.  The patient has had a recent MRI showing mild to moderate generalized cortical atrophy that has progressed from an MRI that was conducted on 2010.  There also white matter changes identified most consistent with moderate to severe chronic microvascular ischemic changes with large confluency's in the parietal white matter.  The patient also reports that approximately 9 to 10 years ago he had a transient ischemic attack in his eye and lost vision in his  left eye for about 15 minutes during this event.  Studies have suggested that he is suffered no permanent damage to his eye and his vision cleared up after the event.  The patient denies any further acute vascular events.  The patient reports that his wife started noticing changes in telling him he was having changes about 1-1/2 years ago and that when he has difficulty remembering something or having problems he does not always remember it and others do tell him that he does not realize the level of difficulties and cognitive problems that he is having.    Disposition/Plan:  We have set the patient up for formal neuropsychological testing to derive both baseline measures of current cognitive and memory functions as well as to help facilitate with diagnostic considerations.  The patient will be administered the Wechsler Adult Intelligence Scale-IV as well as the Wechsler Memory Scale-IV.  Diagnosis:    Cognitive changes  Memory loss         Electronically Signed   _______________________ Ilean Skill, Psy.D.

## 2019-03-19 ENCOUNTER — Telehealth: Payer: Self-pay | Admitting: Internal Medicine

## 2019-03-19 DIAGNOSIS — M159 Polyosteoarthritis, unspecified: Secondary | ICD-10-CM

## 2019-03-19 NOTE — Telephone Encounter (Signed)
Pt's wife is calling and stated pt's dosage need to be changed to 1/2 tablet twice day as needed for meloxicam (MOBIC) 15 MG tablet. Please advise

## 2019-03-20 NOTE — Telephone Encounter (Signed)
Are you okay with increasing the dosage of meloxicam to 1/2 tablet twice per day?

## 2019-03-24 NOTE — Telephone Encounter (Signed)
yes

## 2019-03-25 MED ORDER — MELOXICAM 15 MG PO TABS: 7.5000 mg | ORAL_TABLET | Freq: Two times a day (BID) | ORAL | 0 refills | Status: DC

## 2019-03-25 NOTE — Telephone Encounter (Signed)
Called back [employer] (pt spouse) and informed increase in daily dose approved by PCP. New erx sent with new sig.

## 2019-04-09 ENCOUNTER — Other Ambulatory Visit: Payer: Self-pay

## 2019-04-09 ENCOUNTER — Encounter: Payer: Self-pay | Admitting: Neurology

## 2019-04-09 ENCOUNTER — Ambulatory Visit (INDEPENDENT_AMBULATORY_CARE_PROVIDER_SITE_OTHER): Payer: Medicare Other | Admitting: Neurology

## 2019-04-09 VITALS — BP 139/86 | HR 96 | Temp 99.5°F | Wt 206.4 lb

## 2019-04-09 DIAGNOSIS — R4189 Other symptoms and signs involving cognitive functions and awareness: Secondary | ICD-10-CM | POA: Diagnosis not present

## 2019-04-09 DIAGNOSIS — I6782 Cerebral ischemia: Secondary | ICD-10-CM | POA: Diagnosis not present

## 2019-04-09 NOTE — Progress Notes (Signed)
[facility]    Provider:  Dr Jaynee Eagles Referring Provider: Janith Lima, MD Primary Care Provider:  Janith Lima, MD  CC:  Memory loss  Overall, the results of the current neuropsychological evaluation do not suggest any significant or severe neurocognitive deficits.  There is certainly no indications of degenerative dementia such as Alzheimer's, Lewy body or other degenerative cortical dementias.  The patient is only areas of difficulties were relative to his own exceptional premorbid/historical excellent cognitive functioning.    Diagnosis:                     The patient's diagnostic considerations should primarily be related to his cerebrovascular small vessel disease and the effects on white matter tracts that are impacting primarily attention and encoding variables.  It is likely that this attention, encoding, multi processing changes are having the primary impact on his memory and learning difficulties.  It is not as much and inability to learn, store, organize new information it is more in aspect of breaking down with his attentional abilities.    We discussed in detail. We reviewed the MRI imaging of his brain that showed atrophy and moderate to severe chronic small-vessel disease.We discussed, I amswered all questions.  HPI:  Connor Johnson is a 78 y.o. male here as requested by provider Janith Lima, MD for memory impairment.  Past medical history hypertension, TIA, emphysema or COPD, osteoarthritis, hyperlipidemia, prediabetes. Here with his wife who provides much information. Patient says he walks into a room and forgets why he is there. Wife says he is worse later in the day, more "sundowning", he doesn't remember things he did recently more short term memory. Forgetting recent conversations, repeats the same questions and stories in the same day, patient pays the bills on time, he manages his own medications but patient watches and monitors because he won;t  remember to order the prescription, no money mangement issues, he knows the day, date, month and year, remembers anniversaries and birthdays but having more difficulty with children's birthdays, kids noticed his memory issues a little bit. His wife is a Marine scientist. He has hearing loss and just had new hearing aids. He has always had difficulty multi-tasking and that is worsening, he has worsening attention, he has a hobby of trains and still enjoys that, he is very creative still building but his :continuity" isn;t what it used to be, he doesn't like to travel anymore but no reduction socially, goes to the gym 3x a week. No changes in personality or mood but he does have less patience. Parents were alcoholics and so unclear if they had dementia but no hx of alzheimers. He is more sensitive and wife feels she can't say anything to him. No other focal neurologic deficits, associated symptoms, inciting events or modifiable factors.  Reviewed notes, labs and imaging from outside physicians, which showed: Reviewed notes from Dr. Ronnald Ramp who has been seeing him since 2017.  Dr. Ronnald Ramp has been treating him for hypertension and hyperlipidemia.  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  Also treating him for prediabetes.  He had a TIA in 2015 or 2016 and was treated by a neurologist in [facility], described it as amaurosis fugax.  He has chronic arthritis and both feet.  He was maintained on aspirin and Plavix in the past but currently now just on low-dose aspirin.Marland Kitchen  He is very active and works out in  the gym.  He quit smoking 24 years ago.  Last hemoglobin A1c was 6.3.  Review of Systems: Patient complains of symptoms per HPI as well as the following symptoms:snoring, easy bruising and bleeding, cough, snoring,joint pain, hearing loss, runny nose. Pertinent negatives and positives per HPI. All others negative.   Social History    Socioeconomic History   Marital status: Married    Spouse name: Not on file   Number of children: 4   Years of education: 14.5   Highest education level: Not on file  Occupational History   Occupation: retired  Scientist, product/process development strain: Not hard at [employer]    Worry: Never true    Inability: Never true   Transportation needs    Medical: No    Non-medical: No  Tobacco Use   Smoking status: Former Smoker    Packs/day: 1.50    Types: Cigarettes    Quit date: 09/16/1995    Years since quitting: 23.5   Smokeless tobacco: Never Used  Substance and Sexual Activity   Alcohol use: Yes    Alcohol/week: 1.0 - 2.0 standard drinks    Types: 1 - 2 Glasses of wine per week   Drug use: No   Sexual activity: Yes  Lifestyle   Physical activity    Days per week: 2 days    Minutes per session: 150+ min   Stress: Not at all  Relationships   Social connections    Talks on phone: More than three times a week    Gets together: More than three times a week    Attends religious service: Not on file    Active member of club or organization: Not on file    Attends meetings of clubs or organizations: Not on file    Relationship status: Married   Intimate partner violence    Fear of current or ex partner: No    Emotionally abused: No    Physically abused: No    Forced sexual activity: No  Other Topics Concern   Not on file  Social History Narrative   Lives at home with wife [name]   Right handed   Caffeine: 4-7 cups daily    Family History  Problem Relation Age of Onset   Liver cancer Father    Alcoholism Father    Prostate cancer Brother    Diabetes Brother    Heart disease Paternal Grandmother    Heart disease Mother    Alcoholism Mother    Colon cancer Neg Hx    Esophageal cancer Neg Hx    Rectal cancer Neg Hx    Stomach cancer Neg Hx    Dementia Neg Hx     Past Medical History:  Diagnosis Date   Arthritis     Cataract    COPD (chronic obstructive pulmonary disease) ([place])    Diverticulitis    Diverticulosis    HLD (hyperlipidemia)    Hypertension    TIA (transient ischemic attack) 2013   temporary loss of vision in L eye     Patient Active Problem List   Diagnosis Date Noted   Subcortical microvascular ischemic occlusive disease 04/09/2019   Cognitive changes 10/10/2018   Pulmonary emphysema ([place]) 09/17/2018   Routine general medical examination at a health care facility 07/19/2016   Benign prostatic hyperplasia without lower urinary tract symptoms 07/19/2016   Generalized OA 04/27/2016   Prediabetes 10/18/2015   Hyperlipidemia with target LDL less than 70  10/18/2015   HTN (hypertension) 09/16/2015   TIA (transient ischemic attack) 09/16/2015    Past Surgical History:  Procedure Laterality Date   COLONOSCOPY     PENILE PROSTHESIS IMPLANT  07/30/2006   Dr. Fenton Malling    Current Outpatient Medications  Medication Sig Dispense Refill   EQ ASPIRIN ADULT LOW DOSE 81 MG EC tablet TAKE 1 TABLET BY MOUTH ONCE DAILY 90 tablet 1   meloxicam (MOBIC) 15 MG tablet Take 0.5 tablets (7.5 mg total) by mouth 2 (two) times a day. 90 tablet 0   neomycin-polymyxin B (NEOSPORIN) SOLN neomycin-polymyxin-hydrocort 3.5 mg/mL-10,000 unit/mL-1 % ear solution  INSTILL 4 DROPS INTO THE AFFECTED EAR(S) TWICE DAILY FOR 5 DAYS     rosuvastatin (CRESTOR) 20 MG tablet Take 0.5 tablets (10 mg total) by mouth daily. 45 tablet 1   telmisartan (MICARDIS) 40 MG tablet TAKE 1 TABLET BY MOUTH ONCE DAILY 90 tablet 1   Current Facility-Administered Medications  Medication Dose Route Frequency Provider Last Rate Last Dose   0.9 %  sodium chloride infusion  500 mL Intravenous Once Armbruster, Carlota Raspberry, MD        Allergies as of 04/09/2019 - Review Complete 04/09/2019  Allergen Reaction Noted   Morphine and related Other (See Comments) 09/01/2015   Hctz [hydrochlorothiazide] Rash 09/16/2015     Vitals: BP 139/86    Pulse 96    Temp 99.5 F (37.5 C) (Oral) Comment (Src): wife's temp 98.2   Wt 206 lb 6.4 oz (93.6 kg)    BMI 33.31 kg/m  Last Weight:  Wt Readings from Last 1 Encounters:  04/09/19 206 lb 6.4 oz (93.6 kg)   Last Height:   Ht Readings from Last 1 Encounters:  11/20/18 5\' 6"  (1.676 m)     Physical exam: Exam: Gen: NAD, conversant, well nourised, obese, well groomed                     CV: RRR, no MRG. No Carotid Bruits. No peripheral edema, warm, nontender Eyes: Conjunctivae clear without exudates or hemorrhage  Neuro: Detailed Neurologic Exam  Speech:    Speech is normal; fluent and spontaneous with normal comprehension.  Cognition:  MMSE - Mini Mental State Exam 10/09/2018 07/31/2017  Orientation to time 5 5  Orientation to Place 5 5  Registration 3 3  Attention/ Calculation 3 5  Recall 3 2  Language- name 2 objects 2 2  Language- repeat 1 1  Language- follow 3 step command 3 3  Language- read & follow direction 1 1  Write a sentence 1 1  Copy design 1 1  Total score 28 29       The patient is oriented to person, place, and time;     recent and remote memory intact;     language fluent;     normal attention, concentration,     fund of knowledge Cranial Nerves:    The pupils are equal, round, and reactive to light.attempted fundoscopy could not visualize due to small pupils. . Visual fields are full to finger confrontation. Extraocular movements are intact. Trigeminal sensation is intact and the muscles of mastication are normal. The face is symmetric. The palate elevates in the midline. Hearing intact. Voice is normal. Shoulder shrug is normal. The tongue has normal motion without fasciculations.   Coordination:    Normal finger to nose and heel to shin.   Gait:    Not ataxic  Motor Observation:    No asymmetry,  no atrophy, and no involuntary movements noted. Tone:    Normal muscle tone.    Posture:    Posture is normal. normal  erect    Strength:    Strength is V/V in the upper and lower limbs.      Sensation: intact to LT     Reflex Exam:  DTR's:    Deep tendon reflexes in the upper and lower extremities are normal bilaterally.   Toes:    The toes are downgoing bilaterally.   Clonus:    Clonus is absent.    Assessment/Plan:  78 y.o. male here as requested by provider Janith Lima, MD for memory impairment.  Past medical history hypertension, TIA, emphysema or COPD, osteoarthritis, hyperlipidemia, prediabetes.   The patient's diagnostic considerations should primarily be related to his cerebrovascular small vessel disease and the effects on white matter tracts that are impacting primarily attention and encoding variables.  It is likely that this attention, encoding, multi processing changes are having the primary impact on his memory and learning difficulties.  It is not as much and inability to learn, store, organize new information it is more in aspect of breaking down with his attentional abilities.    We discussed in detail. We reviewed the MRI imaging of his brain that showed atrophy and moderate to severe chronic small-vessel disease.We discussed, I amswered all questions. He needs strict control of vascular risk factors and he is at higher risk for stroke.   I had a long d/w patient about his cerebrovascular disease burden, risk for stroke/TIAs, personally independently reviewed imaging studies and answered questions.Continue ASA for primary stroke prevention and maintain strict control of hypertension with blood pressure goal below 130/90, diabetes with hemoglobin A1c goal below 6.5% and lipids with LDL cholesterol goal below 70 mg/dL.I also advised the patient to eat a healthy diet with plenty of whole grains, cereals, fruits and vegetables, exercise regularly and maintain ideal body weight .Followup in the future with me as needed. Can consider repeating formal memory testing in the future if needed  A  total of 40  minutes was spent face-to-face with this patient. Over half this time was spent on counseling patient on the  1. Subcortical microvascular ischemic occlusive disease   2. Cognitive changes    diagnosis and different diagnostic and therapeutic options, counseling and coordination of care, risks ans benefits of management, compliance, or risk factor reduction and education.     Cc: Janith Lima, MD,    Sarina Ill, MD  [facility] [address] [place], [place] [address]  Phone [phone] Fax [phone]

## 2019-04-09 NOTE — Patient Instructions (Signed)

## 2019-04-17 ENCOUNTER — Encounter: Payer: Medicare Other | Attending: Psychology | Admitting: Psychology

## 2019-04-17 ENCOUNTER — Other Ambulatory Visit: Payer: Self-pay

## 2019-04-17 ENCOUNTER — Other Ambulatory Visit: Payer: Self-pay | Admitting: Internal Medicine

## 2019-04-17 DIAGNOSIS — R4189 Other symptoms and signs involving cognitive functions and awareness: Secondary | ICD-10-CM | POA: Insufficient documentation

## 2019-04-17 DIAGNOSIS — I679 Cerebrovascular disease, unspecified: Secondary | ICD-10-CM | POA: Diagnosis not present

## 2019-04-17 DIAGNOSIS — E785 Hyperlipidemia, unspecified: Secondary | ICD-10-CM

## 2019-04-17 DIAGNOSIS — I6789 Other cerebrovascular disease: Secondary | ICD-10-CM | POA: Diagnosis not present

## 2019-04-17 DIAGNOSIS — R413 Other amnesia: Secondary | ICD-10-CM | POA: Diagnosis not present

## 2019-04-17 DIAGNOSIS — G453 Amaurosis fugax: Secondary | ICD-10-CM

## 2019-04-21 IMAGING — DX DG CHEST 2V
2 series · 2 of 2 positions shown · non-contrast
Comparison: None.

CLINICAL DATA: Cough, hypertension, former smoking history

EXAM:
CHEST - 2 VIEW

[chest pa]
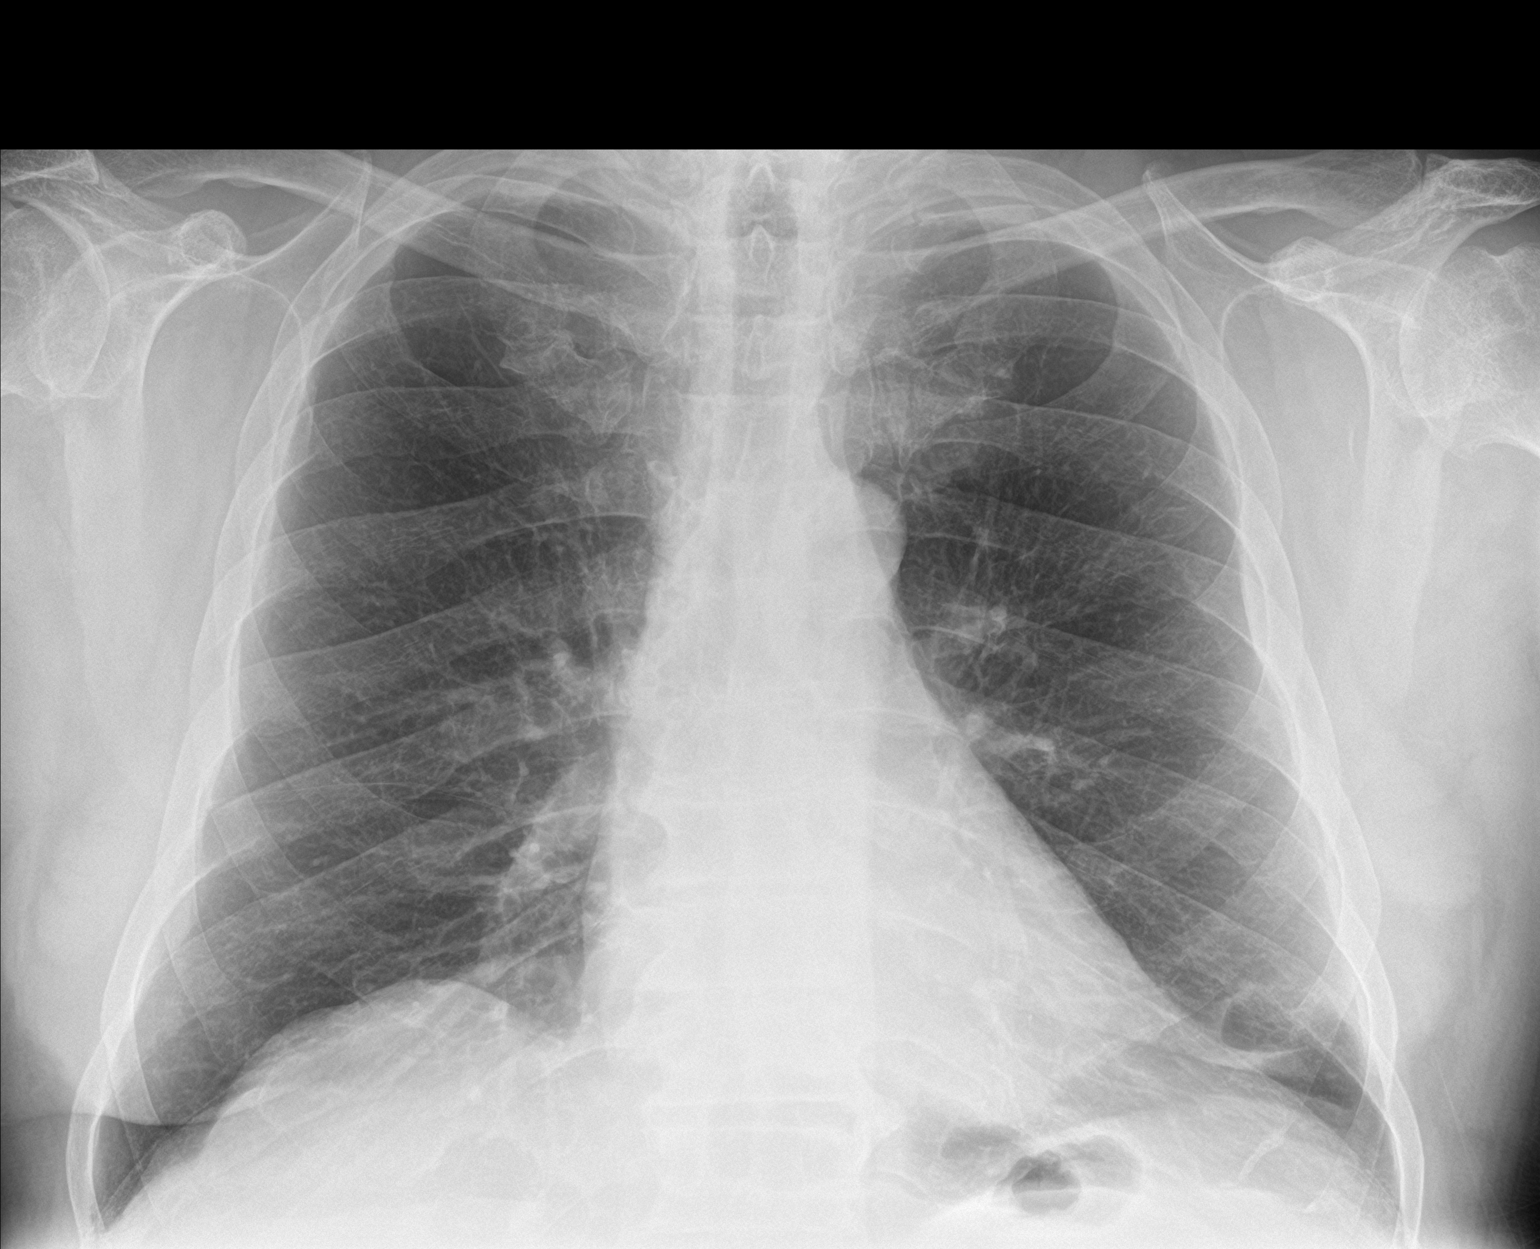

[chest lat]
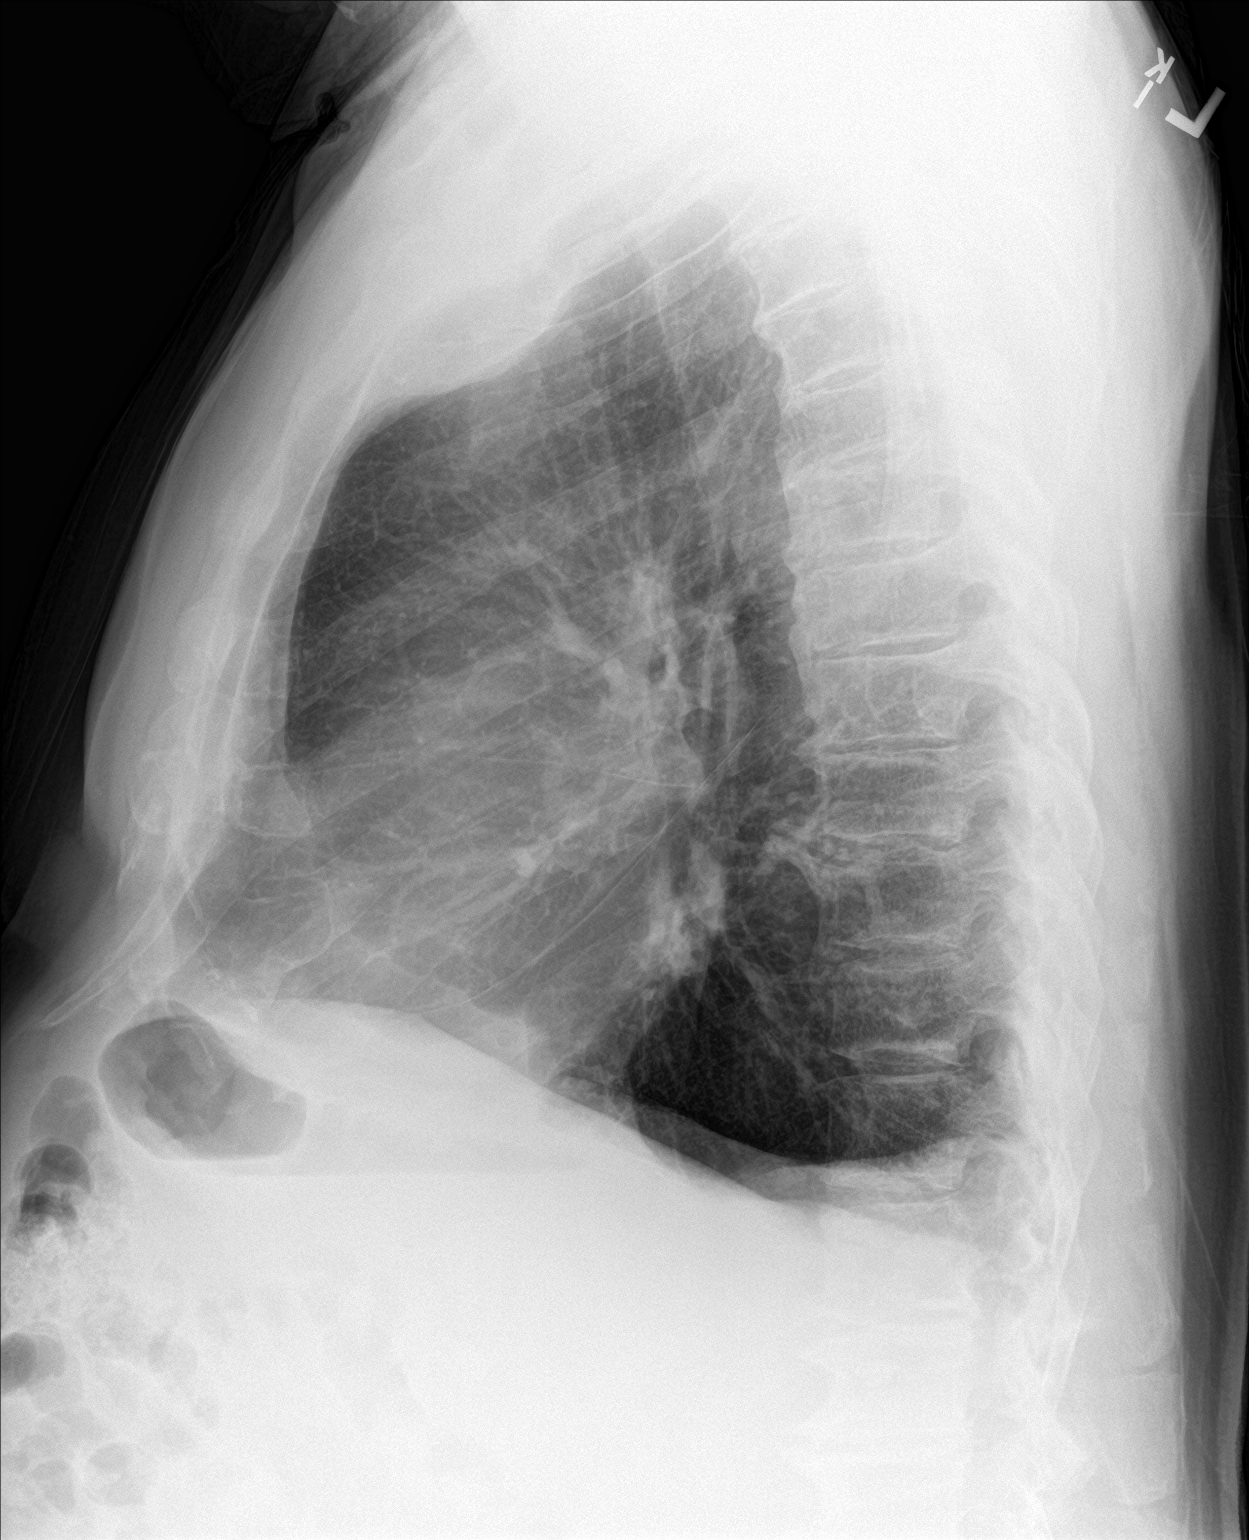

[2 of 2 positions shown; findings below may reference images not displayed]

FINDINGS: The lungs are somewhat hyperaerated with slightly flattened
hemidiaphragms suggesting and element of emphysema. No pneumonia or
effusion is currently seen. Mediastinal and hilar contours are
unremarkable and the heart is borderline enlarged. There are
degenerative changes throughout the thoracic spine. Degenerative
changes also noted in the shoulders.
IMPRESSION: 1. Hyperaeration may indicate mild emphysematous change.
2. No pneumonia or effusion.

## 2019-04-25 ENCOUNTER — Ambulatory Visit: Payer: Medicare Other | Admitting: Psychology

## 2019-05-01 ENCOUNTER — Ambulatory Visit: Payer: Medicare Other | Admitting: Psychology

## 2019-05-05 ENCOUNTER — Other Ambulatory Visit: Payer: Self-pay | Admitting: Internal Medicine

## 2019-05-05 DIAGNOSIS — I10 Essential (primary) hypertension: Secondary | ICD-10-CM

## 2019-05-05 DIAGNOSIS — G453 Amaurosis fugax: Secondary | ICD-10-CM

## 2019-05-05 DIAGNOSIS — G459 Transient cerebral ischemic attack, unspecified: Secondary | ICD-10-CM

## 2019-05-13 DIAGNOSIS — Z87891 Personal history of nicotine dependence: Secondary | ICD-10-CM | POA: Diagnosis not present

## 2019-05-13 DIAGNOSIS — H6982 Other specified disorders of Eustachian tube, left ear: Secondary | ICD-10-CM | POA: Diagnosis not present

## 2019-05-13 DIAGNOSIS — H6123 Impacted cerumen, bilateral: Secondary | ICD-10-CM | POA: Diagnosis not present

## 2019-05-23 DIAGNOSIS — H60313 Diffuse otitis externa, bilateral: Secondary | ICD-10-CM | POA: Diagnosis not present

## 2019-05-30 ENCOUNTER — Encounter: Payer: Self-pay | Admitting: Psychology

## 2019-05-30 NOTE — Progress Notes (Signed)
Today I provided feedback regarding the results of the recent neuropsychological evaluation.  Below you will find a copy of the summary and results section from that formal neuropsychological evaluation that was completed on 04/17/2019.    Summary of Results:                        Overall, the results of the current neuropsychological evaluation do not suggest any significant or severe neurocognitive deficits.  There is certainly no indications of degenerative dementia such as Alzheimer's, Lewy body or other degenerative cortical dementias.  The patient's only areas of difficulties were relative to his own exceptional premorbid/historically excellent cognitive functioning.  The patient produced full scale IQ scores and general abilities index scores in the high average to superior range with particular skills for visual spatial abilities and verbal skills including general fund of information, verbal reasoning and problem-solving, and comprehension and social judgment.  The patient has excellent perceptual reasoning abilities are consistent with his experience and his educational patterns as well as his occupational history.  The patient's memory was below predicted levels but still fell in the average range relative to his normative comparisons.  The one area that tended to stand out had to do with certain aspects of attention and concentration.  This is found in multi processing abilities and more complex encoding abilities both visual and auditorily.  The impression of this pattern is again not consistent with cortically mediated degenerative dementias but is consistent with findings of his recent MRI and comparisons to previous imaging studies.  The patient's small vessel disease and changes in white matter are likely the primary culprit for his current mild cognitive difficulties particularly around encoding abilities and its negative impact on the effectiveness and efficiency of his memory and learning.   Given the patient's likely prior excellent cognitive functioning it is more noticeable to the patient and his wife as in general even the areas that he is having difficulty with are still in the average range relative to a normative comparison group.  It will be very important for the patient going forward to pay particular attention to his cardiovascular health and cerebrovascular health.  Good diet, physical activity, good sleep and monitoring issues such as his high blood pressure and other cardiovascular variables will be very important.  The patient is already showing some changes in his small vessel disease and white matter structures that would go beyond simple age-related changes.  However, as I stated before this does not appear to be a condition consistent with factor such as Alzheimer's or Lewy body dementia.  If the patient continues to see progressive changes it would be warranted to do repeat testing in approximately 9 months to assess for further deterioration now that we have a good baseline measure.  Diagnosis:                     The patient's diagnostic considerations should primarily be related to his cerebrovascular small vessel disease and the effects on white matter tracts that are impacting primarily attention and encoding variables.  It is likely that this attention, encoding, multi processing changes are having the primary impact on his memory and learning difficulties.  It is not as much and inability to learn, store, organize new information it is more in aspect of breaking down with his attentional abilities.    Ilean Skill, Psy.D. Neuropsychologist

## 2019-06-24 ENCOUNTER — Other Ambulatory Visit: Payer: Self-pay | Admitting: Internal Medicine

## 2019-06-24 DIAGNOSIS — M159 Polyosteoarthritis, unspecified: Secondary | ICD-10-CM

## 2019-07-16 DIAGNOSIS — H60313 Diffuse otitis externa, bilateral: Secondary | ICD-10-CM | POA: Diagnosis not present

## 2019-07-17 ENCOUNTER — Other Ambulatory Visit: Payer: Self-pay | Admitting: Internal Medicine

## 2019-07-17 DIAGNOSIS — E785 Hyperlipidemia, unspecified: Secondary | ICD-10-CM

## 2019-07-17 DIAGNOSIS — G453 Amaurosis fugax: Secondary | ICD-10-CM

## 2019-07-19 ENCOUNTER — Other Ambulatory Visit: Payer: Self-pay | Admitting: Internal Medicine

## 2019-07-19 DIAGNOSIS — G459 Transient cerebral ischemic attack, unspecified: Secondary | ICD-10-CM

## 2019-07-30 ENCOUNTER — Other Ambulatory Visit: Payer: Self-pay | Admitting: Internal Medicine

## 2019-07-30 DIAGNOSIS — G453 Amaurosis fugax: Secondary | ICD-10-CM

## 2019-07-30 DIAGNOSIS — I10 Essential (primary) hypertension: Secondary | ICD-10-CM

## 2019-08-04 DIAGNOSIS — H60313 Diffuse otitis externa, bilateral: Secondary | ICD-10-CM | POA: Diagnosis not present

## 2019-08-06 ENCOUNTER — Other Ambulatory Visit: Payer: Self-pay

## 2019-08-16 DIAGNOSIS — Z23 Encounter for immunization: Secondary | ICD-10-CM | POA: Diagnosis not present

## 2019-08-18 DIAGNOSIS — H60313 Diffuse otitis externa, bilateral: Secondary | ICD-10-CM | POA: Diagnosis not present

## 2019-08-18 DIAGNOSIS — R05 Cough: Secondary | ICD-10-CM | POA: Diagnosis not present

## 2019-09-25 DIAGNOSIS — H524 Presbyopia: Secondary | ICD-10-CM | POA: Diagnosis not present

## 2019-09-25 DIAGNOSIS — H5203 Hypermetropia, bilateral: Secondary | ICD-10-CM | POA: Diagnosis not present

## 2019-09-25 DIAGNOSIS — Z135 Encounter for screening for eye and ear disorders: Secondary | ICD-10-CM | POA: Diagnosis not present

## 2019-09-25 DIAGNOSIS — H52203 Unspecified astigmatism, bilateral: Secondary | ICD-10-CM | POA: Diagnosis not present

## 2019-10-13 ENCOUNTER — Other Ambulatory Visit: Payer: Self-pay | Admitting: Internal Medicine

## 2019-10-13 DIAGNOSIS — M159 Polyosteoarthritis, unspecified: Secondary | ICD-10-CM

## 2019-10-13 DIAGNOSIS — G453 Amaurosis fugax: Secondary | ICD-10-CM

## 2019-10-13 DIAGNOSIS — E785 Hyperlipidemia, unspecified: Secondary | ICD-10-CM

## 2019-10-13 DIAGNOSIS — G459 Transient cerebral ischemic attack, unspecified: Secondary | ICD-10-CM

## 2019-10-13 MED ORDER — ROSUVASTATIN CALCIUM 20 MG PO TABS: ORAL_TABLET | ORAL | 1 refills | Status: DC

## 2019-10-13 MED ORDER — ASPIRIN 81 MG PO TBEC: 81.0000 mg | DELAYED_RELEASE_TABLET | Freq: Every day | ORAL | 0 refills | Status: DC

## 2019-10-13 MED ORDER — MELOXICAM 15 MG PO TABS: ORAL_TABLET | ORAL | 0 refills | Status: DC

## 2019-10-13 NOTE — Telephone Encounter (Signed)
         1. Which medications need to be refilled? (please list name of each medication and dose if known) EQ ASPIRIN ADULT LOW DOSE 81 MG EC tablet meloxicam (MOBIC) 15 MG tablet rosuvastatin (CRESTOR) 20 MG tablet     2. Which pharmacy/location (including street and city if local pharmacy) is medication to be sent to? [facility] / Phone [phone]   3. Do they need a 30 day or 90 day supply? [place]

## 2019-10-23 DIAGNOSIS — H6982 Other specified disorders of Eustachian tube, left ear: Secondary | ICD-10-CM | POA: Diagnosis not present

## 2019-11-06 ENCOUNTER — Telehealth: Payer: Self-pay | Admitting: Internal Medicine

## 2019-11-06 DIAGNOSIS — G453 Amaurosis fugax: Secondary | ICD-10-CM

## 2019-11-06 DIAGNOSIS — I10 Essential (primary) hypertension: Secondary | ICD-10-CM

## 2019-11-06 MED ORDER — TELMISARTAN 40 MG PO TABS: 40.0000 mg | ORAL_TABLET | Freq: Every day | ORAL | 0 refills | Status: DC

## 2019-11-06 NOTE — Telephone Encounter (Signed)
Contacted pt and spoke to wife. appt scheduled. 30 day erx has been sent.

## 2019-11-06 NOTE — Telephone Encounter (Signed)
Requesting a refill on the following medication. telmisartan (MICARDIS) 40 MG tablet [id number] - [place], [place] - [address] AT [place] Phone:  [phone]  Fax:  [phone]

## 2019-11-06 NOTE — Telephone Encounter (Signed)
Pt last seen on 09/19/2018. No upcoming appointment.

## 2019-11-17 DIAGNOSIS — R05 Cough: Secondary | ICD-10-CM | POA: Diagnosis not present

## 2019-11-17 DIAGNOSIS — H7322 Unspecified myringitis, left ear: Secondary | ICD-10-CM | POA: Diagnosis not present

## 2019-11-19 ENCOUNTER — Ambulatory Visit (INDEPENDENT_AMBULATORY_CARE_PROVIDER_SITE_OTHER): Payer: Medicare Other | Admitting: Internal Medicine

## 2019-11-19 ENCOUNTER — Other Ambulatory Visit: Payer: Self-pay

## 2019-11-19 ENCOUNTER — Encounter: Payer: Self-pay | Admitting: Internal Medicine

## 2019-11-19 VITALS — BP 142/84 | HR 107 | Temp 97.9°F | Resp 16 | Ht 66.0 in | Wt 207.0 lb

## 2019-11-19 DIAGNOSIS — R7303 Prediabetes: Secondary | ICD-10-CM

## 2019-11-19 DIAGNOSIS — E785 Hyperlipidemia, unspecified: Secondary | ICD-10-CM | POA: Diagnosis not present

## 2019-11-19 DIAGNOSIS — N400 Enlarged prostate without lower urinary tract symptoms: Secondary | ICD-10-CM | POA: Diagnosis not present

## 2019-11-19 DIAGNOSIS — I10 Essential (primary) hypertension: Secondary | ICD-10-CM | POA: Diagnosis not present

## 2019-11-19 LAB — HEPATIC FUNCTION PANEL
ALT: 15 U/L (ref 0–53)
AST: 15 U/L (ref 0–37)
Albumin: 4.2 g/dL (ref 3.5–5.2)
Alkaline Phosphatase: 52 U/L (ref 39–117)
Bilirubin, Direct: 0.2 mg/dL (ref 0.0–0.3)
Total Bilirubin: 0.7 mg/dL (ref 0.2–1.2)
Total Protein: 6.8 g/dL (ref 6.0–8.3)

## 2019-11-19 LAB — CBC WITH DIFFERENTIAL/PLATELET
Basophils Absolute: 0 10*3/uL (ref 0.0–0.1)
Basophils Relative: 0.3 % (ref 0.0–3.0)
Eosinophils Absolute: 0 10*3/uL (ref 0.0–0.7)
Eosinophils Relative: 0.1 % (ref 0.0–5.0)
HCT: 44.4 % (ref 39.0–52.0)
Hemoglobin: 15.2 g/dL (ref 13.0–17.0)
Lymphocytes Relative: 6.8 % — ABNORMAL LOW (ref 12.0–46.0)
Lymphs Abs: 0.4 10*3/uL — ABNORMAL LOW (ref 0.7–4.0)
MCHC: 34.3 g/dL (ref 30.0–36.0)
MCV: 95.1 fl (ref 78.0–100.0)
Monocytes Absolute: 0.3 10*3/uL (ref 0.1–1.0)
Monocytes Relative: 5.1 % (ref 3.0–12.0)
Neutro Abs: 5.1 10*3/uL (ref 1.4–7.7)
Neutrophils Relative %: 87.7 % — ABNORMAL HIGH (ref 43.0–77.0)
Platelets: 265 10*3/uL (ref 150.0–400.0)
RBC: 4.67 Mil/uL (ref 4.22–5.81)
RDW: 12.9 % (ref 11.5–15.5)
WBC: 5.8 10*3/uL (ref 4.0–10.5)

## 2019-11-19 LAB — BASIC METABOLIC PANEL
BUN: 16 mg/dL (ref 6–23)
CO2: 29 mEq/L (ref 19–32)
Calcium: 9.2 mg/dL (ref 8.4–10.5)
Chloride: 103 mEq/L (ref 96–112)
Creatinine, Ser: 0.97 mg/dL (ref 0.40–1.50)
GFR: 75.08 mL/min (ref >60.00)
Glucose, Bld: 140 mg/dL — ABNORMAL HIGH (ref 70–99)
Potassium: 4.6 mEq/L (ref 3.5–5.1)
Sodium: 137 mEq/L (ref 135–145)

## 2019-11-19 LAB — LIPID PANEL
Cholesterol: 145 mg/dL (ref 0–200)
HDL: 68.1 mg/dL (ref >39.00)
LDL Cholesterol: 66 mg/dL (ref 0–99)
NonHDL: 77.18
Total CHOL/HDL Ratio: 2
Triglycerides: 58 mg/dL (ref 0.0–149.0)
VLDL: 11.6 mg/dL (ref 0.0–40.0)

## 2019-11-19 LAB — PSA: PSA: 1.91 ng/mL (ref 0.10–4.00)

## 2019-11-19 LAB — TSH: TSH: 1.28 u[IU]/mL (ref 0.35–4.50)

## 2019-11-19 LAB — HEMOGLOBIN A1C: Hgb A1c MFr Bld: 6.1 % (ref 4.6–6.5)

## 2019-11-19 NOTE — Patient Instructions (Signed)

## 2019-11-19 NOTE — Progress Notes (Signed)
Subjective:  Patient ID: Connor Johnson, male    DOB: June 20, 1941  Age: 79 y.o. MRN: [id number]  CC: Annual Exam, Hypertension, and Hyperlipidemia  This visit occurred during the SARS-CoV-2 public health emergency.  Safety protocols were in place, including screening questions prior to the visit, additional usage of staff PPE, and extensive cleaning of exam room while observing appropriate contact time as indicated for disinfecting solutions.    HPI Pele Stumpo presents for a CPX.  He is currently seeing an ENT doctor for eustachian tube dysfunction and left-sided otitis externa.  He is getting better with the combination of prednisone and cefdinir.  He tells me he is very active and denies any recent episodes of chest pain, shortness of breath, diaphoresis, palpitations, dizziness, or lightheadedness.  Outpatient Medications Prior to Visit  Medication Sig Dispense Refill  . aspirin (EQ ASPIRIN ADULT LOW DOSE) 81 MG EC tablet Take 1 tablet (81 mg total) by mouth daily. Swallow whole. 90 tablet 0  . cefdinir (OMNICEF) 300 MG capsule Take by mouth.    . meloxicam (MOBIC) 15 MG tablet Take 1/2 (one-half) tablet by mouth twice daily 90 tablet 0  . neomycin-polymyxin B (NEOSPORIN) SOLN neomycin-polymyxin-hydrocort 3.5 mg/mL-10,000 unit/mL-1 % ear solution  INSTILL 4 DROPS INTO THE AFFECTED EAR(S) TWICE DAILY FOR 5 DAYS    . predniSONE (DELTASONE) 20 MG tablet Take by mouth.    . rosuvastatin (CRESTOR) 20 MG tablet Take 1/2 (one-half) tablet by mouth once daily 45 tablet 1  . telmisartan (MICARDIS) 40 MG tablet Take 1 tablet (40 mg total) by mouth daily. 30 tablet 0  . 0.9 %  sodium chloride infusion      No facility-administered medications prior to visit.    ROS Review of Systems  Constitutional: Negative for chills, diaphoresis, fatigue and fever.  HENT: Negative.   Eyes: Negative for visual disturbance.  Respiratory: Negative for cough, chest tightness, shortness of breath and wheezing.     Cardiovascular: Negative for chest pain, palpitations and leg swelling.  Gastrointestinal: Negative for abdominal pain, constipation, diarrhea, nausea and vomiting.  Endocrine: Negative.   Genitourinary: Negative.  Negative for difficulty urinating.  Musculoskeletal: Negative for arthralgias and myalgias.  Skin: Negative.   Neurological: Negative.  Negative for dizziness, weakness, light-headedness, numbness and headaches.  Hematological: Negative for adenopathy. Does not bruise/bleed easily.  Psychiatric/Behavioral: Negative.     Objective:  BP (!) 142/84 (BP Location: Left Arm, Patient Position: Sitting, Cuff Size: Large)   Pulse (!) 107   Temp 97.9 F (36.6 C) (Oral)   Resp 16   Ht 5\' 6"  (1.676 m)   Wt 207 lb (93.9 kg)   SpO2 94%   BMI 33.41 kg/m   BP Readings from Last 3 Encounters:  11/19/19 (!) 142/84  04/09/19 139/86  11/20/18 118/69    Wt Readings from Last 3 Encounters:  11/19/19 207 lb (93.9 kg)  04/09/19 206 lb 6.4 oz (93.6 kg)  11/20/18 207 lb (93.9 kg)    Physical Exam Vitals reviewed.  Constitutional:      Appearance: Normal appearance.  HENT:     Nose: Nose normal.     Mouth/Throat:     Mouth: Mucous membranes are moist.  Eyes:     General: No scleral icterus.    Conjunctiva/sclera: Conjunctivae normal.  Cardiovascular:     Rate and Rhythm: Regular rhythm. Tachycardia present.     Heart sounds: Normal heart sounds, S1 normal and S2 normal. No murmur.     Comments:  EKG -  Sinus tachycardia, 101 bpm NS ST abnormality No changes compared to the prior EKG Pulmonary:     Effort: Pulmonary effort is normal.     Breath sounds: No stridor. No wheezing, rhonchi or rales.  Abdominal:     General: Abdomen is flat. Bowel sounds are normal. There is no distension.     Palpations: Abdomen is soft. There is no hepatomegaly, splenomegaly or mass.     Tenderness: There is no abdominal tenderness.     Hernia: No hernia is present. There is no hernia in the  left inguinal area or right inguinal area.  Genitourinary:    Pubic Area: No rash.      Penis: Normal and circumcised. No discharge, swelling or lesions.      Testes: Normal.        Right: Mass or tenderness not present.        Left: Mass or tenderness not present.     Epididymis:     Right: Normal. Not enlarged. No mass.     Left: Normal. Not enlarged. No mass.     Prostate: Enlarged (1+ smooth symm BPH). Not tender and no nodules present.     Rectum: Normal. Guaiac result negative. No mass, tenderness, anal fissure, external hemorrhoid or internal hemorrhoid. Normal anal tone.     Comments: + penile prosthesis Musculoskeletal:        General: Normal range of motion.     Cervical back: Neck supple.     Right lower leg: No edema.     Left lower leg: No edema.  Lymphadenopathy:     Cervical: No cervical adenopathy.     Lower Body: No right inguinal adenopathy. No left inguinal adenopathy.  Skin:    General: Skin is warm and dry.     Findings: No rash.  Neurological:     General: No focal deficit present.     Mental Status: He is alert.  Psychiatric:        Mood and Affect: Mood normal.        Behavior: Behavior normal.     Lab Results  Component Value Date   WBC 5.8 11/19/2019   HGB 15.2 11/19/2019   HCT 44.4 11/19/2019   PLT 265.0 11/19/2019   GLUCOSE 140 (H) 11/19/2019   CHOL 145 11/19/2019   TRIG 58.0 11/19/2019   HDL 68.10 11/19/2019   LDLCALC 66 11/19/2019   ALT 15 11/19/2019   AST 15 11/19/2019   NA 137 11/19/2019   K 4.6 11/19/2019   CL 103 11/19/2019   CREATININE 0.97 11/19/2019   BUN 16 11/19/2019   CO2 29 11/19/2019   TSH 1.28 11/19/2019   PSA 1.91 11/19/2019   HGBA1C 6.1 11/19/2019    MR BRAIN W WO CONTRAST  Result Date: 11/01/2018  [facility] [address] [place], [place] [address] [phone] NEUROIMAGING REPORT STUDY DATE: 10/31/2018 PATIENT NAME: Connor Johnson DOB: [birth year] MRN: [id number] EXAM: MRI Brain with and  without contrast ORDERING CLINICIAN: Sarina Ill, MD CLINICAL HISTORY: 79 year old man with encephalopathy, memory loss and confusion COMPARISON FILMS: MRI 06/10/2009 TECHNIQUE:MRI of the brain with and without contrast was obtained utilizing 5 mm axial slices with T1, T2, T2 flair, SWI and diffusion weighted views.  T1 sagittal, T2 coronal and postcontrast views in the axial and coronal plane were obtained. CONTRAST: 20 ml Multihance IMAGING SITE: [facility], [place]. FINDINGS: On sagittal images, the spinal cord is imaged caudally to C3  and is normal in caliber.   The contents of the posterior fossa are of normal size and position.   The pituitary gland and optic chiasm appear normal.    There is mild to moderate generalized cortical atrophy that has progressed compared to the 2010 MRI.  There are no abnormal extra-axial collections of fluid.  There are a couple small T2/flair hyperintense foci in the pons and one in the right cerebellar hemisphere.  The deep gray matter appears normal.  In the hemispheres there are single and confluent T2/flair hyperintense foci with the largest confluencies in the periatrial white matter.  The white matter changes have only slightly changed compared to the 2010 MRI.  Diffusion weighted images are normal.  Susceptibility weighted images are normal.   The orbits appear normal.   The VIIth/VIIIth nerve complex appears normal.  There is a left mastoid effusion.  The right mastoid air cells appear normal.  The paranasal sinuses appear normal.  Flow voids are identified within the major intracerebral arteries.  After the infusion of contrast material, a normal enhancement pattern is noted.   This MRI of the brain with and without contrast shows the following: 1.    Mild to moderate generalized cortical atrophy progressed compared to the 2010 MRI. 2.    White matter changes most consistent with moderately severe chronic microvascular ischemic change with large  confluencies in the periatrial white matter.  This has just slightly progressed compared to the 2010 MRI. 3.    Left mastoid effusion, likely due to eustachian tube dysfunction INTERPRETING PHYSICIAN: Richard A. Felecia Shelling, MD, PhD, FAAN Certified in  Neuroimaging by [employer]    Assessment & Plan:   Kristain was seen today for annual exam, hypertension and hyperlipidemia.  Diagnoses and all orders for this visit:  Essential hypertension- His blood pressure is adequately well controlled.  Electrolytes and renal function are normal. -     CBC with Differential/Platelet -     TSH -     EKG 12-Lead  Benign prostatic hyperplasia without lower urinary tract symptoms- His PSA is normal which is a reassuring sign that he does not have prostate cancer.  He has no symptoms that need to be treated. -     PSA  Hyperlipidemia with target LDL less than 70- He has achieved his LDL goal is doing well on the statin. -     Lipid panel -     TSH -     Hepatic function panel  Prediabetes- His A1c is at 6.1%.  Medical therapy is not indicated. -     Basic metabolic panel -     Hemoglobin A1c   I am having Rennis Harding maintain his neomycin-polymyxin B, meloxicam, rosuvastatin, aspirin, telmisartan, cefdinir, and predniSONE. We will stop administering sodium chloride.  No orders of the defined types were placed in this encounter.    Follow-up: Return in about 6 months (around 05/21/2020).  Scarlette Calico, MD

## 2019-11-20 ENCOUNTER — Encounter: Payer: Self-pay | Admitting: Internal Medicine

## 2019-11-20 DIAGNOSIS — Z23 Encounter for immunization: Secondary | ICD-10-CM | POA: Diagnosis not present

## 2019-11-26 ENCOUNTER — Telehealth: Payer: Self-pay

## 2019-11-26 DIAGNOSIS — G453 Amaurosis fugax: Secondary | ICD-10-CM

## 2019-11-26 DIAGNOSIS — I10 Essential (primary) hypertension: Secondary | ICD-10-CM

## 2019-11-26 MED ORDER — TELMISARTAN 40 MG PO TABS: 40.0000 mg | ORAL_TABLET | Freq: Every day | ORAL | 1 refills | Status: DC

## 2019-11-26 NOTE — Telephone Encounter (Signed)
erx has been sent.  

## 2019-11-26 NOTE — Telephone Encounter (Signed)
Wife asking for a 90 days supply.   1.Medication Requested:telmisartan (MICARDIS) 40 MG tablet  2. Pharmacy (Name, [facility]): [place], [place] - [address] AT [place]  3. On Med List: Yes   4. Last Visit with PCP: 3.10.21   5. Next visit date with PCP: no appt is made at this time    Agent: Please be advised that RX refills may take up to 3 business days. We ask that you follow-up with your pharmacy.

## 2019-11-27 DIAGNOSIS — H6983 Other specified disorders of Eustachian tube, bilateral: Secondary | ICD-10-CM | POA: Diagnosis not present

## 2019-11-27 DIAGNOSIS — H90A32 Mixed conductive and sensorineural hearing loss, unilateral, left ear with restricted hearing on the contralateral side: Secondary | ICD-10-CM | POA: Diagnosis not present

## 2019-12-08 DIAGNOSIS — H6982 Other specified disorders of Eustachian tube, left ear: Secondary | ICD-10-CM | POA: Diagnosis not present

## 2019-12-23 DIAGNOSIS — Z23 Encounter for immunization: Secondary | ICD-10-CM | POA: Diagnosis not present

## 2020-01-09 ENCOUNTER — Other Ambulatory Visit: Payer: Self-pay | Admitting: Internal Medicine

## 2020-01-09 DIAGNOSIS — M159 Polyosteoarthritis, unspecified: Secondary | ICD-10-CM

## 2020-01-09 DIAGNOSIS — G459 Transient cerebral ischemic attack, unspecified: Secondary | ICD-10-CM

## 2020-01-14 DIAGNOSIS — H6121 Impacted cerumen, right ear: Secondary | ICD-10-CM | POA: Diagnosis not present

## 2020-01-14 DIAGNOSIS — H6502 Acute serous otitis media, left ear: Secondary | ICD-10-CM | POA: Diagnosis not present

## 2020-01-14 DIAGNOSIS — H6982 Other specified disorders of Eustachian tube, left ear: Secondary | ICD-10-CM | POA: Diagnosis not present

## 2020-03-09 ENCOUNTER — Other Ambulatory Visit: Payer: Self-pay

## 2020-03-09 ENCOUNTER — Encounter: Payer: Self-pay | Admitting: Internal Medicine

## 2020-03-09 ENCOUNTER — Ambulatory Visit (INDEPENDENT_AMBULATORY_CARE_PROVIDER_SITE_OTHER): Payer: Medicare Other | Admitting: Internal Medicine

## 2020-03-09 VITALS — BP 142/86 | HR 91 | Temp 98.4°F | Resp 16 | Ht 66.0 in | Wt 204.0 lb

## 2020-03-09 DIAGNOSIS — R7303 Prediabetes: Secondary | ICD-10-CM | POA: Diagnosis not present

## 2020-03-09 DIAGNOSIS — I10 Essential (primary) hypertension: Secondary | ICD-10-CM | POA: Diagnosis not present

## 2020-03-09 NOTE — Progress Notes (Signed)
Subjective:  Patient ID: Connor Johnson, male    DOB: February 11, 1941  Age: 79 y.o. MRN: [id number]  CC: Hypertension  This visit occurred during the SARS-CoV-2 public health emergency.  Safety protocols were in place, including screening questions prior to the visit, additional usage of staff PPE, and extensive cleaning of exam room while observing appropriate contact time as indicated for disinfecting solutions.    HPI Connor Johnson presents for f/up - He tells me his blood pressure is well controlled.  He is very active and denies any recent episodes of headache, blurred vision, chest pain, shortness of breath, dyspnea on exertion, edema, or fatigue.  He has no side effects from the ARB.  Outpatient Medications Prior to Visit  Medication Sig Dispense Refill  . aspirin 81 MG EC tablet TAKE 1 TABLET(81 MG) BY MOUTH DAILY. SWALLOW WHOLE 90 tablet 0  . meloxicam (MOBIC) 15 MG tablet TAKE 1/2 TABLET BY MOUTH TWICE DAILY 90 tablet 0  . neomycin-polymyxin B (NEOSPORIN) SOLN neomycin-polymyxin-hydrocort 3.5 mg/mL-10,000 unit/mL-1 % ear solution  INSTILL 4 DROPS INTO THE AFFECTED EAR(S) TWICE DAILY FOR 5 DAYS    . rosuvastatin (CRESTOR) 20 MG tablet Take 1/2 (one-half) tablet by mouth once daily 45 tablet 1  . telmisartan (MICARDIS) 40 MG tablet Take 1 tablet (40 mg total) by mouth daily. 90 tablet 1   No facility-administered medications prior to visit.    ROS Review of Systems  Constitutional: Negative for diaphoresis and fatigue.  HENT: Negative.   Eyes: Negative for visual disturbance.  Respiratory: Negative for cough, chest tightness, shortness of breath and wheezing.   Cardiovascular: Negative for chest pain, palpitations and leg swelling.  Gastrointestinal: Negative for abdominal pain, diarrhea, nausea and vomiting.  Endocrine: Negative.   Genitourinary: Negative.  Negative for difficulty urinating.  Musculoskeletal: Positive for arthralgias. Negative for myalgias and neck pain.  Skin:  Negative.  Negative for color change.  Neurological: Negative for dizziness, weakness, light-headedness and numbness.  Hematological: Negative for adenopathy. Does not bruise/bleed easily.  Psychiatric/Behavioral: Negative.     Objective:  BP (!) 142/86 (BP Location: Left Arm, Patient Position: Sitting, Cuff Size: Normal)   Pulse 91   Temp 98.4 F (36.9 C) (Oral)   Resp 16   Ht 5\' 6"  (1.676 m)   Wt 204 lb (92.5 kg)   SpO2 96%   BMI 32.93 kg/m   BP Readings from Last 3 Encounters:  03/09/20 (!) 142/86  11/19/19 (!) 142/84  04/09/19 139/86    Wt Readings from Last 3 Encounters:  03/09/20 204 lb (92.5 kg)  11/19/19 207 lb (93.9 kg)  04/09/19 206 lb 6.4 oz (93.6 kg)    Physical Exam Vitals reviewed.  Constitutional:      Appearance: Normal appearance.  HENT:     Nose: Nose normal.     Mouth/Throat:     Mouth: Mucous membranes are moist.  Eyes:     General: No scleral icterus.    Conjunctiva/sclera: Conjunctivae normal.  Cardiovascular:     Rate and Rhythm: Normal rate and regular rhythm.     Heart sounds: No murmur heard.   Pulmonary:     Effort: Pulmonary effort is normal.     Breath sounds: No stridor. No wheezing, rhonchi or rales.  Abdominal:     General: Abdomen is flat. Bowel sounds are normal. There is no distension.     Palpations: Abdomen is soft. There is no hepatomegaly, splenomegaly or mass.     Tenderness: There is no  abdominal tenderness.  Musculoskeletal:        General: Normal range of motion.     Cervical back: Neck supple.     Right lower leg: No edema.     Left lower leg: No edema.  Lymphadenopathy:     Cervical: No cervical adenopathy.  Skin:    General: Skin is warm and dry.     Coloration: Skin is not pale.  Neurological:     General: No focal deficit present.     Mental Status: He is alert.     Lab Results  Component Value Date   WBC 5.8 11/19/2019   HGB 15.2 11/19/2019   HCT 44.4 11/19/2019   PLT 265.0 11/19/2019   GLUCOSE  140 (H) 11/19/2019   CHOL 145 11/19/2019   TRIG 58.0 11/19/2019   HDL 68.10 11/19/2019   LDLCALC 66 11/19/2019   ALT 15 11/19/2019   AST 15 11/19/2019   NA 137 11/19/2019   K 4.6 11/19/2019   CL 103 11/19/2019   CREATININE 0.97 11/19/2019   BUN 16 11/19/2019   CO2 29 11/19/2019   TSH 1.28 11/19/2019   PSA 1.91 11/19/2019   HGBA1C 6.1 11/19/2019    MR BRAIN W WO CONTRAST  Result Date: 11/01/2018  [facility] [address] [place], [place] [address] [phone] NEUROIMAGING REPORT STUDY DATE: 10/31/2018 PATIENT NAME: Connor Johnson DOB: [birth year] MRN: [id number] EXAM: MRI Brain with and without contrast ORDERING CLINICIAN: Sarina Ill, MD CLINICAL HISTORY: 79 year old man with encephalopathy, memory loss and confusion COMPARISON FILMS: MRI 06/10/2009 TECHNIQUE:MRI of the brain with and without contrast was obtained utilizing 5 mm axial slices with T1, T2, T2 flair, SWI and diffusion weighted views.  T1 sagittal, T2 coronal and postcontrast views in the axial and coronal plane were obtained. CONTRAST: 20 ml Multihance IMAGING SITE: [facility], [place]. FINDINGS: On sagittal images, the spinal cord is imaged caudally to C3 and is normal in caliber.   The contents of the posterior fossa are of normal size and position.   The pituitary gland and optic chiasm appear normal.    There is mild to moderate generalized cortical atrophy that has progressed compared to the 2010 MRI.  There are no abnormal extra-axial collections of fluid.  There are a couple small T2/flair hyperintense foci in the pons and one in the right cerebellar hemisphere.  The deep gray matter appears normal.  In the hemispheres there are single and confluent T2/flair hyperintense foci with the largest confluencies in the periatrial white matter.  The white matter changes have only slightly changed compared to the 2010 MRI.  Diffusion weighted images are normal.  Susceptibility weighted images  are normal.   The orbits appear normal.   The VIIth/VIIIth nerve complex appears normal.  There is a left mastoid effusion.  The right mastoid air cells appear normal.  The paranasal sinuses appear normal.  Flow voids are identified within the major intracerebral arteries.  After the infusion of contrast material, a normal enhancement pattern is noted.   This MRI of the brain with and without contrast shows the following: 1.    Mild to moderate generalized cortical atrophy progressed compared to the 2010 MRI. 2.    White matter changes most consistent with moderately severe chronic microvascular ischemic change with large confluencies in the periatrial white matter.  This has just slightly progressed compared to the 2010 MRI. 3.    Left mastoid effusion, likely due to eustachian tube  dysfunction INTERPRETING PHYSICIAN: Richard A. Felecia Shelling, MD, PhD, FAAN Certified in  Neuroimaging by [employer]    Assessment & Plan:   Adrien was seen today for hypertension.  Diagnoses and all orders for this visit:  Essential hypertension- His blood pressure is adequately well controlled.  Will continue the current dose of the ARB.  Prediabetes- His most recent A1c was 6.1%.  Medical therapy is not indicated.   I am having Rennis Harding maintain his neomycin-polymyxin B, rosuvastatin, telmisartan, meloxicam, and aspirin.  No orders of the defined types were placed in this encounter.    Follow-up: Return in about 6 months (around 09/08/2020).  Scarlette Calico, MD

## 2020-03-09 NOTE — Patient Instructions (Signed)

## 2020-03-10 DIAGNOSIS — H60313 Diffuse otitis externa, bilateral: Secondary | ICD-10-CM | POA: Diagnosis not present

## 2020-03-10 DIAGNOSIS — H6982 Other specified disorders of Eustachian tube, left ear: Secondary | ICD-10-CM | POA: Diagnosis not present

## 2020-03-22 DIAGNOSIS — H6982 Other specified disorders of Eustachian tube, left ear: Secondary | ICD-10-CM | POA: Diagnosis not present

## 2020-04-05 ENCOUNTER — Other Ambulatory Visit: Payer: Self-pay | Admitting: Internal Medicine

## 2020-04-05 DIAGNOSIS — E785 Hyperlipidemia, unspecified: Secondary | ICD-10-CM

## 2020-04-05 DIAGNOSIS — G453 Amaurosis fugax: Secondary | ICD-10-CM

## 2020-04-06 ENCOUNTER — Other Ambulatory Visit: Payer: Self-pay | Admitting: Internal Medicine

## 2020-04-06 DIAGNOSIS — M159 Polyosteoarthritis, unspecified: Secondary | ICD-10-CM

## 2020-05-26 DIAGNOSIS — H6982 Other specified disorders of Eustachian tube, left ear: Secondary | ICD-10-CM | POA: Diagnosis not present

## 2020-05-26 DIAGNOSIS — H66005 Acute suppurative otitis media without spontaneous rupture of ear drum, recurrent, left ear: Secondary | ICD-10-CM | POA: Diagnosis not present

## 2020-06-01 ENCOUNTER — Other Ambulatory Visit: Payer: Self-pay | Admitting: Internal Medicine

## 2020-06-01 DIAGNOSIS — I10 Essential (primary) hypertension: Secondary | ICD-10-CM

## 2020-06-01 DIAGNOSIS — G453 Amaurosis fugax: Secondary | ICD-10-CM

## 2020-06-30 ENCOUNTER — Other Ambulatory Visit: Payer: Self-pay | Admitting: Internal Medicine

## 2020-06-30 DIAGNOSIS — M159 Polyosteoarthritis, unspecified: Secondary | ICD-10-CM

## 2020-07-29 DIAGNOSIS — Z23 Encounter for immunization: Secondary | ICD-10-CM | POA: Diagnosis not present

## 2020-08-12 DIAGNOSIS — R053 Chronic cough: Secondary | ICD-10-CM | POA: Diagnosis not present

## 2020-08-12 DIAGNOSIS — H66005 Acute suppurative otitis media without spontaneous rupture of ear drum, recurrent, left ear: Secondary | ICD-10-CM | POA: Diagnosis not present

## 2020-08-12 DIAGNOSIS — H6982 Other specified disorders of Eustachian tube, left ear: Secondary | ICD-10-CM | POA: Diagnosis not present

## 2020-09-14 ENCOUNTER — Telehealth: Payer: Self-pay | Admitting: Internal Medicine

## 2020-09-14 NOTE — Telephone Encounter (Signed)
LVM for pt to rtn my call to schedule awv with nha. Please schedule AWV with [facility] if pt calls the office  

## 2020-09-18 ENCOUNTER — Other Ambulatory Visit: Payer: Self-pay | Admitting: Internal Medicine

## 2020-09-18 DIAGNOSIS — E785 Hyperlipidemia, unspecified: Secondary | ICD-10-CM

## 2020-09-18 DIAGNOSIS — G453 Amaurosis fugax: Secondary | ICD-10-CM

## 2020-09-24 DIAGNOSIS — H66005 Acute suppurative otitis media without spontaneous rupture of ear drum, recurrent, left ear: Secondary | ICD-10-CM | POA: Diagnosis not present

## 2020-09-24 DIAGNOSIS — H6982 Other specified disorders of Eustachian tube, left ear: Secondary | ICD-10-CM | POA: Diagnosis not present

## 2020-09-30 ENCOUNTER — Ambulatory Visit: Payer: Medicare Other

## 2020-10-02 ENCOUNTER — Other Ambulatory Visit: Payer: Self-pay | Admitting: Internal Medicine

## 2020-10-02 DIAGNOSIS — M159 Polyosteoarthritis, unspecified: Secondary | ICD-10-CM

## 2020-10-05 DIAGNOSIS — H524 Presbyopia: Secondary | ICD-10-CM | POA: Diagnosis not present

## 2020-10-05 DIAGNOSIS — H52223 Regular astigmatism, bilateral: Secondary | ICD-10-CM | POA: Diagnosis not present

## 2020-10-05 DIAGNOSIS — H5203 Hypermetropia, bilateral: Secondary | ICD-10-CM | POA: Diagnosis not present

## 2020-10-05 DIAGNOSIS — H2513 Age-related nuclear cataract, bilateral: Secondary | ICD-10-CM | POA: Diagnosis not present

## 2020-10-05 DIAGNOSIS — H04123 Dry eye syndrome of bilateral lacrimal glands: Secondary | ICD-10-CM | POA: Diagnosis not present

## 2020-10-07 DIAGNOSIS — Z23 Encounter for immunization: Secondary | ICD-10-CM | POA: Diagnosis not present

## 2020-10-08 ENCOUNTER — Other Ambulatory Visit: Payer: Self-pay

## 2020-10-08 ENCOUNTER — Ambulatory Visit (INDEPENDENT_AMBULATORY_CARE_PROVIDER_SITE_OTHER): Payer: Medicare Other

## 2020-10-08 VITALS — BP 138/80 | HR 100 | Temp 98.4°F | Ht 66.0 in | Wt 209.6 lb

## 2020-10-08 DIAGNOSIS — Z0000 Encounter for general adult medical examination without abnormal findings: Secondary | ICD-10-CM | POA: Diagnosis not present

## 2020-10-08 NOTE — Patient Instructions (Signed)
Connor Johnson , Thank you for taking time to come for your Medicare Wellness Visit. I appreciate your ongoing commitment to your health goals. Please review the following plan we discussed and let me know if I can assist you in the future.   Screening recommendations/referrals: Colonoscopy: 11/20/2018 Recommended yearly ophthalmology/optometry visit for glaucoma screening and checkup Recommended yearly dental visit for hygiene and checkup  Vaccinations: Influenza vaccine: 06/2020 Pneumococcal vaccine: 07/17/2017, 07/19/2016 Tdap vaccine: 05/21/2013; due every 10 years Shingles vaccine: never done   Covid-19: 11/20/2019, 12/23/2019  Advanced directives: Please bring a copy of your health care power of attorney and living will to the office at your convenience.  Conditions/risks identified: Yes; Reviewed health maintenance screenings with patient today and relevant education, vaccines, and/or referrals were provided. Please continue to do your personal lifestyle choices by: daily care of teeth and gums, regular physical activity (goal should be 5 days a week for 30 minutes), eat a healthy diet, avoid tobacco and drug use, limiting any alcohol intake, taking a low-dose aspirin (if not allergic or have been advised by your Aloise Copus otherwise) and taking vitamins and minerals as recommended by your Elenora Hawbaker. Continue doing brain stimulating activities (puzzles, reading, adult coloring books, staying active) to keep memory sharp. Continue to eat heart healthy diet (full of fruits, vegetables, whole grains, lean protein, water--limit salt, fat, and sugar intake) and increase physical activity as tolerated.  Next appointment: Please schedule your next Medicare Wellness Visit with your Nurse Health Advisor in 1 year by calling [phone]. Preventive Care 32 Years and Older, Male Preventive care refers to lifestyle choices and visits with your health care Ananias Kolander that can promote health and wellness. What does  preventive care include?  A yearly physical exam. This is also called an annual well check.  Dental exams once or twice a year.  Routine eye exams. Ask your health care Sanyah Molnar how often you should have your eyes checked.  Personal lifestyle choices, including:  Daily care of your teeth and gums.  Regular physical activity.  Eating a healthy diet.  Avoiding tobacco and drug use.  Limiting alcohol use.  Practicing safe sex.  Taking low doses of aspirin every day.  Taking vitamin and mineral supplements as recommended by your health care Antonia Culbertson. What happens during an annual well check? The services and screenings done by your health care Katherinne Mofield during your annual well check will depend on your age, overall health, lifestyle risk factors, and family history of disease. Counseling  Your health care Girolamo Lortie may ask you questions about your:  Alcohol use.  Tobacco use.  Drug use.  Emotional well-being.  Home and relationship well-being.  Sexual activity.  Eating habits.  History of falls.  Memory and ability to understand (cognition).  Work and work Statistician. Screening  You may have the following tests or measurements:  Height, weight, and BMI.  Blood pressure.  Lipid and cholesterol levels. These may be checked every 5 years, or more frequently if you are over 49 years old.  Skin check.  Lung cancer screening. You may have this screening every year starting at age 54 if you have a 30-pack-year history of smoking and currently smoke or have quit within the past 15 years.  Fecal occult blood test (FOBT) of the stool. You may have this test every year starting at age 71.  Flexible sigmoidoscopy or colonoscopy. You may have a sigmoidoscopy every 5 years or a colonoscopy every 10 years starting at age 57.  Prostate cancer  screening. Recommendations will vary depending on your family history and other risks.  Hepatitis C blood test.  Hepatitis B  blood test.  Sexually transmitted disease (STD) testing.  Diabetes screening. This is done by checking your blood sugar (glucose) after you have not eaten for a while (fasting). You may have this done every 1-3 years.  Abdominal aortic aneurysm (AAA) screening. You may need this if you are a current or former smoker.  Osteoporosis. You may be screened starting at age 44 if you are at high risk. Talk with your health care Polette Nofsinger about your test results, treatment options, and if necessary, the need for more tests. Vaccines  Your health care Rosela Supak may recommend certain vaccines, such as:  Influenza vaccine. This is recommended every year.  Tetanus, diphtheria, and acellular pertussis (Tdap, Td) vaccine. You may need a Td booster every 10 years.  Zoster vaccine. You may need this after age 61.  Pneumococcal 13-valent conjugate (PCV13) vaccine. One dose is recommended after age 62.  Pneumococcal polysaccharide (PPSV23) vaccine. One dose is recommended after age 26. Talk to your health care Tobey Schmelzle about which screenings and vaccines you need and how often you need them. This information is not intended to replace advice given to you by your health care Sereniti Wan. Make sure you discuss any questions you have with your health care Dominie Benedick. Document Released: 09/24/2015 Document Revised: 05/17/2016 Document Reviewed: 06/29/2015 Elsevier Interactive Patient Education  2017 [place] Prevention in the Home Falls can cause injuries. They can happen to people of all ages. There are many things you can do to make your home safe and to help prevent falls. What can I do on the outside of my home?  Regularly fix the edges of walkways and driveways and fix any cracks.  Remove anything that might make you trip as you walk through a door, such as a raised step or threshold.  Trim any bushes or trees on the path to your home.  Use bright outdoor lighting.  Clear any walking paths  of anything that might make someone trip, such as rocks or tools.  Regularly check to see if handrails are loose or broken. Make sure that both sides of any steps have handrails.  Any raised decks and porches should have guardrails on the edges.  Have any leaves, snow, or ice cleared regularly.  Use sand or salt on walking paths during winter.  Clean up any spills in your garage right away. This includes oil or grease spills. What can I do in the bathroom?  Use night lights.  Install grab bars by the toilet and in the tub and shower. Do not use towel bars as grab bars.  Use non-skid mats or decals in the tub or shower.  If you need to sit down in the shower, use a plastic, non-slip stool.  Keep the floor dry. Clean up any water that spills on the floor as soon as it happens.  Remove soap buildup in the tub or shower regularly.  Attach bath mats securely with double-sided non-slip rug tape.  Do not have throw rugs and other things on the floor that can make you trip. What can I do in the bedroom?  Use night lights.  Make sure that you have a light by your bed that is easy to reach.  Do not use any sheets or blankets that are too big for your bed. They should not hang down onto the floor.  Have a firm  chair that has side arms. You can use this for support while you get dressed.  Do not have throw rugs and other things on the floor that can make you trip. What can I do in the kitchen?  Clean up any spills right away.  Avoid walking on wet floors.  Keep items that you use a lot in easy-to-reach places.  If you need to reach something above you, use a strong step stool that has a grab bar.  Keep electrical cords out of the way.  Do not use floor polish or wax that makes floors slippery. If you must use wax, use non-skid floor wax.  Do not have throw rugs and other things on the floor that can make you trip. What can I do with my stairs?  Do not leave any items on  the stairs.  Make sure that there are handrails on both sides of the stairs and use them. Fix handrails that are broken or loose. Make sure that handrails are as long as the stairways.  Check any carpeting to make sure that it is firmly attached to the stairs. Fix any carpet that is loose or worn.  Avoid having throw rugs at the top or bottom of the stairs. If you do have throw rugs, attach them to the floor with carpet tape.  Make sure that you have a light switch at the top of the stairs and the bottom of the stairs. If you do not have them, ask someone to add them for you. What else can I do to help prevent falls?  Wear shoes that:  Do not have high heels.  Have rubber bottoms.  Are comfortable and fit you well.  Are closed at the toe. Do not wear sandals.  If you use a stepladder:  Make sure that it is fully opened. Do not climb a closed stepladder.  Make sure that both sides of the stepladder are locked into place.  Ask someone to hold it for you, if possible.  Clearly mark and make sure that you can see:  Any grab bars or handrails.  First and last steps.  Where the edge of each step is.  Use tools that help you move around (mobility aids) if they are needed. These include:  Canes.  Walkers.  Scooters.  Crutches.  Turn on the lights when you go into a dark area. Replace any light bulbs as soon as they burn out.  Set up your furniture so you have a clear path. Avoid moving your furniture around.  If any of your floors are uneven, fix them.  If there are any pets around you, be aware of where they are.  Review your medicines with your doctor. Some medicines can make you feel dizzy. This can increase your chance of falling. Ask your doctor what other things that you can do to help prevent falls. This information is not intended to replace advice given to you by your health care Tashayla Therien. Make sure you discuss any questions you have with your health care  Champayne Kocian. Document Released: 06/24/2009 Document Revised: 02/03/2016 Document Reviewed: 10/02/2014 Elsevier Interactive Patient Education  2017 [employer].

## 2020-10-08 NOTE — Progress Notes (Signed)
Subjective:   Connor Johnson is a 80 y.o. male who presents for Medicare Annual/Subsequent preventive examination.  Review of Systems    No ROS. Medicare Wellness Visit. Additional risk factors are reflected in social history. Cardiac Risk Factors include: advanced age (>70men, >54 women);dyslipidemia;family history of premature cardiovascular disease;hypertension;male gender;obesity (BMI >30kg/m2)     Objective:    Today's Vitals   10/07/20 0918 10/08/20 1306 10/08/20 1307  BP:  138/80   Pulse:  100   Temp:  98.4 F (36.9 C)   SpO2:  95%   Weight:  209 lb 9.6 oz (95.1 kg)   Height: 5\' 6"  (1.676 m) 5\' 6"  (1.676 m)   PainSc:   0-No pain   Body mass index is 33.83 kg/m.  Advanced Directives 10/08/2020 07/31/2017 07/24/2016 09/27/2015 09/01/2015  Does Patient Have a Medical Advance Directive? Yes Yes Yes Yes Yes  Type of Advance Directive - Healthcare Power of [place];Living will Healthcare Power of [place];Living will Healthcare Power of Attorney -  Does patient want to make changes to medical advance directive? No - Patient declined - No - Patient declined - -  Copy of Healthcare Power of Attorney in Chart? - - Yes - -    Current Medications (verified) Outpatient Encounter Medications as of 10/08/2020  Medication Sig  . aspirin 81 MG EC tablet TAKE 1 TABLET(81 MG) BY MOUTH DAILY. SWALLOW WHOLE  . neomycin-polymyxin B (NEOSPORIN) SOLN neomycin-polymyxin-hydrocort 3.5 mg/mL-10,000 unit/mL-1 % ear solution  INSTILL 4 DROPS INTO THE AFFECTED EAR(S) TWICE DAILY FOR 5 DAYS  . rosuvastatin (CRESTOR) 20 MG tablet TAKE 1/2 TABLET BY MOUTH EVERY DAY  . telmisartan (MICARDIS) 40 MG tablet TAKE 1 TABLET(40 MG) BY MOUTH DAILY  . meloxicam (MOBIC) 15 MG tablet TAKE 1/2 TABLET BY MOUTH TWICE DAILY (Patient taking differently: TAKE 1/2 TABLET BY MOUTH ONCE DAILY AS NEEDED)   No facility-administered encounter medications on file as of 10/08/2020.    Allergies (verified) Morphine and  related and Hctz [hydrochlorothiazide]   History: Past Medical History:  Diagnosis Date  . Arthritis   . Cataract   . COPD (chronic obstructive pulmonary disease) (HCC)   . Diverticulitis   . Diverticulosis   . HLD (hyperlipidemia)   . Hypertension   . TIA (transient ischemic attack) 2013   temporary loss of vision in L eye    Past Surgical History:  Procedure Laterality Date  . COLONOSCOPY    . PENILE PROSTHESIS IMPLANT  07/30/2006   Dr. 2014   Family History  Problem Relation Age of Onset  . Liver cancer Father   . Alcoholism Father   . Prostate cancer Brother   . Diabetes Brother   . Heart disease Paternal Grandmother   . Heart disease Mother   . Alcoholism Mother   . Colon cancer Neg Hx   . Esophageal cancer Neg Hx   . Rectal cancer Neg Hx   . Stomach cancer Neg Hx   . Dementia Neg Hx    Social History   Socioeconomic History  . Marital status: Married    Spouse name: Not on file  . Number of children: 4  . Years of education: 14.5  . Highest education level: Not on file  Occupational History  . Occupation: retired  Tobacco Use  . Smoking status: Former Smoker    Packs/day: 1.50    Types: Cigarettes    Quit date: 09/16/1995    Years since quitting: 25.0  . Smokeless tobacco: Never Used  Vaping Use  . Vaping Use: Never used  Substance and Sexual Activity  . Alcohol use: Yes    Alcohol/week: 1.0 - 2.0 standard drink    Types: 1 - 2 Glasses of wine per week  . Drug use: No  . Sexual activity: Yes  Other Topics Concern  . Not on file  Social History Narrative   Lives at home with wife Connor Johnson   Right handed   Caffeine: 4-7 cups daily   Social Determinants of Health   Financial Resource Strain: Low Risk   . Difficulty of Paying Living Expenses: Not hard at all  Food Insecurity: No Food Insecurity  . Worried About Charity fundraiser in the Last Year: Never true  . Ran Out of Food in the Last Year: Never true  Transportation Needs: No  Transportation Needs  . Lack of Transportation (Medical): No  . Lack of Transportation (Non-Medical): No  Physical Activity: Sufficiently Active  . Days of Exercise per Week: 5 days  . Minutes of Exercise per Session: 30 min  Stress: No Stress Concern Present  . Feeling of Stress : Not at all  Social Connections: Socially Integrated  . Frequency of Communication with Friends and Family: More than three times a week  . Frequency of Social Gatherings with Friends and Family: Once a week  . Attends Religious Services: More than 4 times per year  . Active Member of Clubs or Organizations: Yes  . Attends Archivist Meetings: More than 4 times per year  . 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 no  Interpreter Needed?: No  Information entered by :: Lisette Abu, LPN   Activities of Daily Living In your present state of health, do you have any difficulty performing the following activities: 10/08/2020  Hearing? Y  Comment wears hearing aids  Vision? N  Difficulty concentrating or making decisions? N  Walking or climbing stairs? N  Dressing or bathing? N  Doing errands, shopping? N  Preparing Food and eating ? N  Using the Toilet? N  In the past six months, have you accidently leaked urine? N  Do you have problems with loss of bowel control? N  Managing your Medications? N  Managing your Finances? N  Housekeeping or managing your Housekeeping? N  Some  recent data might be hidden    Patient Care Team: Janith Lima, MD as PCP - General (Internal Medicine) Cherlyn Roberts, OD as Consulting Physician (Optometry)  Indicate any recent Medical Services you may have received from other than [facility] providers in the past year (date may be approximate).     Assessment:   This is a routine wellness examination for Connor Johnson.  Hearing/Vision screen No exam data present  Dietary issues and exercise activities discussed: Current Exercise Habits: Structured exercise class;Home exercise routine, Type of exercise: walking;strength training/weights;stretching;treadmill, Time (Minutes): 30, Frequency (Times/Week): 5, Weekly Exercise (Minutes/Week): 150, Intensity: Moderate, Exercise limited by: None identified  Goals    .  Patient Stated     Stay as healthy as active as possible. Continue to exercise start to eat healthier by reading food labels, monitoring carbohydrates, increase water intake,  and fat. Enjoy life and family.       Depression Screen PHQ 2/9 Scores 10/08/2020 11/19/2019 08/06/2018 07/31/2017 01/30/2017 10/18/2015  PHQ - 2 Score 0 0 1 0 0 0  PHQ- 9 Score - - - 0 1 -    Fall Risk Fall Risk  10/08/2020 11/19/2019 08/06/2019 08/06/2018 07/29/2018  Falls in the past year? 0 0 0 0 0  Comment - - Emmi Telephone Survey: data to providers prior to load - Emmi Telephone Survey: data to providers prior to load  Number falls in past yr: 0 0 - 0 -  Injury with Fall? 0 0 - 0 -  Risk for fall due to : No Fall Risks No Fall Risks - - -  Follow up - Falls evaluation completed - Falls evaluation completed -    FALL RISK PREVENTION PERTAINING TO THE HOME:  Any stairs in or around the home? Yes  If so, are there any without handrails? No  Home free of loose throw rugs in walkways, pet beds, electrical cords, etc? Yes  Adequate lighting in your home to reduce risk of falls? Yes   ASSISTIVE DEVICES UTILIZED TO PREVENT FALLS:  Life alert? No  Use of  a cane, walker or w/c? No  Grab bars in the bathroom? No  Shower chair or bench in shower? No  Elevated toilet seat or a handicapped toilet? Yes   TIMED UP AND GO:  Was the test performed? No .  Length of time to ambulate 10 feet: 0 sec.   Gait steady and fast without use of assistive device  Cognitive Function: MMSE - Mini Mental State Exam 10/09/2018 07/31/2017  Orientation to time 5 5  Orientation to Place 5 5  Registration 3 3  Attention/ Calculation 3 5  Recall 3 2  Language- name 2 objects 2 2  Language- repeat 1 1  Language- follow 3 step command 3 3  Language- read & follow direction 1 1  Write a sentence 1 1  Copy design 1 1  Total score 28 29     6CIT Screen 10/08/2020  What Year? 0 points  What month? 0 points  What time? 0 points  Count back from 20 0 points  Months in reverse 0 points  Repeat phrase 0 points  Total Score 0    Immunizations Immunization History  Administered Date(s) Administered  . Influenza, High Dose Seasonal PF 07/19/2016, 07/17/2017, 08/06/2018  . Influenza-Unspecified 06/12/2015, 06/28/2020  . Moderna Sars-Covid-2 Vaccination 11/20/2019, 12/23/2019, 10/07/2020  . Pneumococcal Conjugate-13 07/19/2016  . Pneumococcal Polysaccharide-23 07/17/2017  . Tdap 05/21/2013    TDAP status: Up to date  Flu Vaccine status: Up to date  Pneumococcal vaccine status: Up to date  Covid-19 vaccine status: Completed vaccines  Qualifies for Shingles Vaccine? Yes   Zostavax completed No   Shingrix Completed?: No.    Education has been provided regarding the importance of this vaccine. Patient has been advised to call insurance company to determine out of pocket expense if they have not yet received this vaccine. Advised may also receive vaccine at local pharmacy or Health Dept. Verbalized acceptance and understanding.  Screening Tests Health Maintenance  Topic Date Due  . Hepatitis C Screening  Never done  . COLONOSCOPY (Pts 45-59yrs Insurance  coverage will need to be confirmed)  11/20/2019  . TETANUS/TDAP  05/22/2023  . INFLUENZA VACCINE  Completed  . COVID-19 Vaccine  Completed  . PNA vac Low Risk Adult  Completed    Health Maintenance  Health Maintenance Due  Topic Date Due  . Hepatitis C Screening  Never done  . COLONOSCOPY (Pts 45-77yrs Insurance coverage will need to be confirmed)  11/20/2019    Colorectal cancer screening: Type of screening: Colonoscopy. Completed 11/20/2018. Repeat every 3 years  Lung Cancer Screening: (Low Dose CT Chest recommended if Age 73-80 years, 30 pack-year currently smoking OR have quit w/in 15years.) does not qualify.   Lung Cancer Screening Referral: no  Additional Screening:  Hepatitis C Screening: does qualify; Completed no  Vision Screening: Recommended annual ophthalmology exams for early detection of glaucoma and other disorders of the eye. Is the patient up to date with their annual eye exam?  Yes  Who is the provider or what is the name of the office in which the patient attends annual eye exams? Nancy Marus, OD. If pt is not established with a provider, would they like to be referred to a provider to establish care? No .   Dental Screening: Recommended annual dental exams for proper oral hygiene  Community Resource Referral / Chronic Care Management: CRR required this visit?  No   CCM required this visit?  No      Plan:     I have personally reviewed and noted the following in the patient's chart:   . Medical and social history . Use of alcohol, tobacco or illicit drugs  . Current medications and supplements . Functional ability and status . Nutritional status . Physical activity . Advanced directives . List of other physicians . Hospitalizations, surgeries, and ER visits in previous 12 months . Vitals . Screenings to include cognitive, depression, and falls . Referrals and appointments  In addition, I have reviewed and discussed with patient certain  preventive protocols, quality metrics, and best practice recommendations. A written personalized care plan for preventive services as well as general preventive health recommendations were provided to patient.     Mickeal Needy, LPN   03/10/5283   Nurse Notes: n/a

## 2020-11-30 ENCOUNTER — Other Ambulatory Visit: Payer: Self-pay | Admitting: Internal Medicine

## 2020-11-30 DIAGNOSIS — I10 Essential (primary) hypertension: Secondary | ICD-10-CM

## 2020-11-30 DIAGNOSIS — G453 Amaurosis fugax: Secondary | ICD-10-CM

## 2020-12-02 DIAGNOSIS — R2681 Unsteadiness on feet: Secondary | ICD-10-CM | POA: Diagnosis not present

## 2020-12-02 DIAGNOSIS — H6982 Other specified disorders of Eustachian tube, left ear: Secondary | ICD-10-CM | POA: Diagnosis not present

## 2020-12-02 DIAGNOSIS — H6121 Impacted cerumen, right ear: Secondary | ICD-10-CM | POA: Diagnosis not present

## 2020-12-02 DIAGNOSIS — H61891 Other specified disorders of right external ear: Secondary | ICD-10-CM | POA: Diagnosis not present

## 2020-12-02 DIAGNOSIS — Z974 Presence of external hearing-aid: Secondary | ICD-10-CM | POA: Diagnosis not present

## 2020-12-02 DIAGNOSIS — H66005 Acute suppurative otitis media without spontaneous rupture of ear drum, recurrent, left ear: Secondary | ICD-10-CM | POA: Diagnosis not present

## 2021-01-10 ENCOUNTER — Other Ambulatory Visit: Payer: Self-pay | Admitting: Internal Medicine

## 2021-01-10 DIAGNOSIS — M159 Polyosteoarthritis, unspecified: Secondary | ICD-10-CM

## 2021-01-19 DIAGNOSIS — H66005 Acute suppurative otitis media without spontaneous rupture of ear drum, recurrent, left ear: Secondary | ICD-10-CM | POA: Diagnosis not present

## 2021-01-31 ENCOUNTER — Telehealth: Payer: Medicare Other | Admitting: Family Medicine

## 2021-03-15 ENCOUNTER — Ambulatory Visit (INDEPENDENT_AMBULATORY_CARE_PROVIDER_SITE_OTHER): Payer: Medicare Other | Admitting: Internal Medicine

## 2021-03-15 ENCOUNTER — Encounter: Payer: Self-pay | Admitting: Internal Medicine

## 2021-03-15 ENCOUNTER — Ambulatory Visit (INDEPENDENT_AMBULATORY_CARE_PROVIDER_SITE_OTHER): Payer: Medicare Other

## 2021-03-15 ENCOUNTER — Other Ambulatory Visit: Payer: Self-pay

## 2021-03-15 VITALS — BP 134/82 | HR 96 | Temp 98.2°F | Resp 16 | Ht 66.0 in | Wt 210.0 lb

## 2021-03-15 DIAGNOSIS — I10 Essential (primary) hypertension: Secondary | ICD-10-CM | POA: Diagnosis not present

## 2021-03-15 DIAGNOSIS — E785 Hyperlipidemia, unspecified: Secondary | ICD-10-CM

## 2021-03-15 DIAGNOSIS — R059 Cough, unspecified: Secondary | ICD-10-CM | POA: Diagnosis not present

## 2021-03-15 DIAGNOSIS — R7303 Prediabetes: Secondary | ICD-10-CM | POA: Diagnosis not present

## 2021-03-15 DIAGNOSIS — J22 Unspecified acute lower respiratory infection: Secondary | ICD-10-CM | POA: Insufficient documentation

## 2021-03-15 LAB — BASIC METABOLIC PANEL
BUN: 16 mg/dL (ref 6–23)
CO2: 29 mEq/L (ref 19–32)
Calcium: 9.2 mg/dL (ref 8.4–10.5)
Chloride: 103 mEq/L (ref 96–112)
Creatinine, Ser: 1 mg/dL (ref 0.40–1.50)
GFR: 72.27 mL/min (ref >60.00)
Glucose, Bld: 92 mg/dL (ref 70–99)
Potassium: 4.2 mEq/L (ref 3.5–5.1)
Sodium: 139 mEq/L (ref 135–145)

## 2021-03-15 LAB — HEPATIC FUNCTION PANEL
ALT: 16 U/L (ref 0–53)
AST: 18 U/L (ref 0–37)
Albumin: 4.3 g/dL (ref 3.5–5.2)
Alkaline Phosphatase: 50 U/L (ref 39–117)
Bilirubin, Direct: 0.2 mg/dL (ref 0.0–0.3)
Total Bilirubin: 0.8 mg/dL (ref 0.2–1.2)
Total Protein: 7.1 g/dL (ref 6.0–8.3)

## 2021-03-15 LAB — LIPID PANEL
Cholesterol: 130 mg/dL (ref 0–200)
HDL: 56.2 mg/dL (ref >39.00)
LDL Cholesterol: 60 mg/dL (ref 0–99)
NonHDL: 74.1
Total CHOL/HDL Ratio: 2
Triglycerides: 72 mg/dL (ref 0.0–149.0)
VLDL: 14.4 mg/dL (ref 0.0–40.0)

## 2021-03-15 LAB — CBC WITH DIFFERENTIAL/PLATELET
Basophils Absolute: 0.1 10*3/uL (ref 0.0–0.1)
Basophils Relative: 0.8 % (ref 0.0–3.0)
Eosinophils Absolute: 0.2 10*3/uL (ref 0.0–0.7)
Eosinophils Relative: 2.4 % (ref 0.0–5.0)
HCT: 44.6 % (ref 39.0–52.0)
Hemoglobin: 15.2 g/dL (ref 13.0–17.0)
Lymphocytes Relative: 18.1 % (ref 12.0–46.0)
Lymphs Abs: 1.3 10*3/uL (ref 0.7–4.0)
MCHC: 34 g/dL (ref 30.0–36.0)
MCV: 93.9 fl (ref 78.0–100.0)
Monocytes Absolute: 0.8 10*3/uL (ref 0.1–1.0)
Monocytes Relative: 11.8 % (ref 3.0–12.0)
Neutro Abs: 4.7 10*3/uL (ref 1.4–7.7)
Neutrophils Relative %: 66.9 % (ref 43.0–77.0)
Platelets: 277 10*3/uL (ref 150.0–400.0)
RBC: 4.74 Mil/uL (ref 4.22–5.81)
RDW: 12.6 % (ref 11.5–15.5)
WBC: 7 10*3/uL (ref 4.0–10.5)

## 2021-03-15 LAB — HEMOGLOBIN A1C: Hgb A1c MFr Bld: 6.4 % (ref 4.6–6.5)

## 2021-03-15 MED ORDER — CEFDINIR 300 MG PO CAPS: 300.0000 mg | ORAL_CAPSULE | Freq: Two times a day (BID) | ORAL | 0 refills | Status: AC

## 2021-03-15 NOTE — Patient Instructions (Signed)

## 2021-03-15 NOTE — Progress Notes (Signed)
Subjective:  Patient ID: Connor Johnson, male    DOB: 1940/10/11  Age: 80 y.o. MRN: [id number]  CC: Cough, Hypertension, and Hyperlipidemia  This visit occurred during the SARS-CoV-2 public health emergency.  Safety protocols were in place, including screening questions prior to the visit, additional usage of staff PPE, and extensive cleaning of exam room while observing appropriate contact time as indicated for disinfecting solutions.    HPI Gagan Dillion presents for f/up -   He complains of a 10-day history of cough that is intermittently productive of yellow phlegm.  He denies fever, chills, wheezing, or night sweats.  He is controlling the cough with Robitussin-DM.  He is very active and denies any recent episodes of dizziness, lightheadedness, dyspnea on exertion, palpitations, edema, or fatigue.  Outpatient Medications Prior to Visit  Medication Sig Dispense Refill   aspirin 81 MG EC tablet TAKE 1 TABLET(81 MG) BY MOUTH DAILY. SWALLOW WHOLE 90 tablet 0   meloxicam (MOBIC) 15 MG tablet TAKE 1/2 TABLET BY MOUTH ONCE DAILY AS NEEDED 90 tablet 0   neomycin-polymyxin B (NEOSPORIN) SOLN neomycin-polymyxin-hydrocort 3.5 mg/mL-10,000 unit/mL-1 % ear solution  INSTILL 4 DROPS INTO THE AFFECTED EAR(S) TWICE DAILY FOR 5 DAYS     rosuvastatin (CRESTOR) 20 MG tablet TAKE 1/2 TABLET BY MOUTH EVERY DAY 45 tablet 1   telmisartan (MICARDIS) 40 MG tablet TAKE 1 TABLET(40 MG) BY MOUTH DAILY 90 tablet 1   No facility-administered medications prior to visit.    ROS Review of Systems  Constitutional:  Negative for appetite change, diaphoresis, fatigue and fever.  HENT: Negative.  Negative for sinus pressure and sore throat.   Eyes: Negative.   Respiratory:  Positive for cough. Negative for chest tightness, shortness of breath and wheezing.   Cardiovascular:  Negative for chest pain, palpitations and leg swelling.  Gastrointestinal:  Negative for abdominal pain, constipation, diarrhea, nausea and vomiting.   Endocrine: Negative.   Genitourinary: Negative.  Negative for difficulty urinating.  Musculoskeletal: Negative.  Negative for back pain and myalgias.  Skin: Negative.   Neurological: Negative.  Negative for dizziness, weakness and light-headedness.  Hematological:  Negative for adenopathy. Does not bruise/bleed easily.  Psychiatric/Behavioral: Negative.     Objective:  BP 134/82 (BP Location: Right Arm, Patient Position: Sitting, Cuff Size: Large)   Pulse 96   Temp 98.2 F (36.8 C) (Oral)   Resp 16   Ht 5\' 6"  (1.676 m)   Wt 210 lb (95.3 kg)   SpO2 99%   BMI 33.89 kg/m   BP Readings from Last 3 Encounters:  03/15/21 134/82  10/08/20 138/80  03/09/20 (!) 142/86    Wt Readings from Last 3 Encounters:  03/15/21 210 lb (95.3 kg)  10/08/20 209 lb 9.6 oz (95.1 kg)  03/09/20 204 lb (92.5 kg)    Physical Exam Vitals reviewed.  Constitutional:      Appearance: Normal appearance.  HENT:     Nose: Nose normal.     Mouth/Throat:     Mouth: Mucous membranes are moist.  Eyes:     Conjunctiva/sclera: Conjunctivae normal.  Cardiovascular:     Rate and Rhythm: Normal rate and regular rhythm.     Heart sounds: No murmur heard.   No gallop.  Pulmonary:     Effort: Pulmonary effort is normal. No respiratory distress.     Breath sounds: No stridor. No wheezing, rhonchi or rales.  Abdominal:     General: Abdomen is flat.     Palpations: There is no  mass.     Tenderness: There is no abdominal tenderness. There is no guarding.  Musculoskeletal:        General: Normal range of motion.     Cervical back: Neck supple.     Right lower leg: No edema.     Left lower leg: No edema.  Lymphadenopathy:     Cervical: No cervical adenopathy.  Skin:    General: Skin is warm and dry.     Coloration: Skin is not pale.  Neurological:     General: No focal deficit present.     Mental Status: He is alert.  Psychiatric:        Mood and Affect: Mood normal.        Behavior: Behavior normal.     Lab Results  Component Value Date   WBC 7.0 03/15/2021   HGB 15.2 03/15/2021   HCT 44.6 03/15/2021   PLT 277.0 03/15/2021   GLUCOSE 92 03/15/2021   CHOL 130 03/15/2021   TRIG 72.0 03/15/2021   HDL 56.20 03/15/2021   LDLCALC 60 03/15/2021   ALT 16 03/15/2021   AST 18 03/15/2021   NA 139 03/15/2021   K 4.2 03/15/2021   CL 103 03/15/2021   CREATININE 1.00 03/15/2021   BUN 16 03/15/2021   CO2 29 03/15/2021   TSH 1.28 11/19/2019   PSA 1.91 11/19/2019   HGBA1C 6.4 03/15/2021    MR BRAIN W WO CONTRAST  Result Date: 11/01/2018  [facility] [address], [place], [place] [address] [phone] NEUROIMAGING REPORT STUDY DATE: 10/31/2018 PATIENT NAME: Connor Johnson DOB: [birth year] MRN: [id number] EXAM: MRI Brain with and without contrast ORDERING CLINICIAN: Sarina Ill, MD CLINICAL HISTORY: 80 year old man with encephalopathy, memory loss and confusion COMPARISON FILMS: MRI 06/10/2009 TECHNIQUE:MRI of the brain with and without contrast was obtained utilizing 5 mm axial slices with T1, T2, T2 flair, SWI and diffusion weighted views.  T1 sagittal, T2 coronal and postcontrast views in the axial and coronal plane were obtained. CONTRAST: 20 ml [employer] IMAGING SITE: [facility], [place]. FINDINGS: On sagittal images, the spinal cord is imaged caudally to C3 and is normal in caliber.   The contents of the posterior fossa are of normal size and position.   The pituitary gland and optic chiasm appear normal.    There is mild to moderate generalized cortical atrophy that has progressed compared to the 2010 MRI.  There are no abnormal extra-axial collections of fluid.  There are a couple small T2/flair hyperintense foci in the pons and one in the right cerebellar hemisphere.  The deep gray matter appears normal.  In the hemispheres there are single and confluent T2/flair hyperintense foci with the largest confluencies in the periatrial white matter.  The white  matter changes have only slightly changed compared to the 2010 MRI.  Diffusion weighted images are normal.  Susceptibility weighted images are normal.   The orbits appear normal.   The VIIth/VIIIth nerve complex appears normal.  There is a left mastoid effusion.  The right mastoid air cells appear normal.  The paranasal sinuses appear normal.  Flow voids are identified within the major intracerebral arteries.  After the infusion of contrast material, a normal enhancement pattern is noted.   This MRI of the brain with and without contrast shows the following: 1.    Mild to moderate generalized cortical atrophy progressed compared to the 2010 MRI. 2.    White matter changes most consistent with moderately severe  chronic microvascular ischemic change with large confluencies in the periatrial white matter.  This has just slightly progressed compared to the 2010 MRI. 3.    Left mastoid effusion, likely due to eustachian tube dysfunction INTERPRETING PHYSICIAN: Richard A. Felecia Shelling, MD, PhD, FAAN Certified in  Neuroimaging by [employer]   DG Chest 2 View  Result Date: 03/15/2021 CLINICAL DATA:  Productive cough. EXAM: CHEST - 2 VIEW COMPARISON:  August 06, 2018. FINDINGS: The heart size and mediastinal contours are within normal limits. Both lungs are clear. The visualized skeletal structures are unremarkable. IMPRESSION: No active cardiopulmonary disease. Electronically Signed   By: Marijo Conception M.D.   On: 03/15/2021 14:25     Assessment & Plan:   Leslee was seen today for cough, hypertension and hyperlipidemia.  Diagnoses and all orders for this visit:  Primary hypertension- His blood pressure is adequately well controlled.  Electrolytes and renal function are normal. -     CBC with Differential/Platelet; Future -     Basic metabolic panel; Future -     Hepatic function panel; Future -     Hepatic function panel -     Basic metabolic panel -     CBC with  Differential/Platelet  Prediabetes- His A1c is up to 6.4%.  Medical therapy is not yet indicated. -     Hemoglobin A1c; Future -     Hemoglobin A1c  Hyperlipidemia with target LDL less than 70- LDL goal achieved. Doing well on the statin  -     Lipid panel; Future -     Hepatic function panel; Future -     Hepatic function panel -     Lipid panel  Cough- Chest x-ray is negative for mass or infiltrate.  COVID-19 antibody is negative.  His labs are reassuring.  I will treat him for lower respiratory tract infection with a broad-spectrum cephalosporin. -     DG Chest 2 View; Future -     SARS-COV-2 IgG; Future -     SARS-COV-2 IgG  LRTI (lower respiratory tract infection) -     cefdinir (OMNICEF) 300 MG capsule; Take 1 capsule (300 mg total) by mouth 2 (two) times daily for 7 days.  I am having Rennis Harding start on cefdinir. I am also having him maintain his neomycin-polymyxin B, aspirin, rosuvastatin, telmisartan, and meloxicam.  Meds ordered this encounter  Medications   cefdinir (OMNICEF) 300 MG capsule    Sig: Take 1 capsule (300 mg total) by mouth 2 (two) times daily for 7 days.    Dispense:  14 capsule    Refill:  0   I spent 40 minutes in preparing to see the patient by review of recent labs and imaging, obtaining and reviewing separately obtained history, communicating with the patient, ordering medications, labs, and xrays, and documenting clinical information in the EHR including the differential Dx, treatment, and any further evaluation and other management of 1. Primary hypertension 2. Prediabetes 3. Hyperlipidemia with target LDL less than 70 4. Cough 5. LRTI (lower respiratory tract infection)       Follow-up: Return if symptoms worsen or fail to improve.  Scarlette Calico, MD

## 2021-03-16 ENCOUNTER — Encounter: Payer: Self-pay | Admitting: Internal Medicine

## 2021-03-16 LAB — SARS-COV-2 IGG: SARS-COV-2 IgG: 0.35

## 2021-03-29 ENCOUNTER — Other Ambulatory Visit: Payer: Self-pay | Admitting: Internal Medicine

## 2021-03-29 DIAGNOSIS — G453 Amaurosis fugax: Secondary | ICD-10-CM

## 2021-03-29 DIAGNOSIS — E785 Hyperlipidemia, unspecified: Secondary | ICD-10-CM

## 2021-05-31 ENCOUNTER — Other Ambulatory Visit: Payer: Self-pay | Admitting: Internal Medicine

## 2021-05-31 DIAGNOSIS — G453 Amaurosis fugax: Secondary | ICD-10-CM

## 2021-05-31 DIAGNOSIS — I10 Essential (primary) hypertension: Secondary | ICD-10-CM

## 2021-06-21 ENCOUNTER — Other Ambulatory Visit: Payer: Self-pay | Admitting: Internal Medicine

## 2021-06-21 DIAGNOSIS — M159 Polyosteoarthritis, unspecified: Secondary | ICD-10-CM

## 2021-06-23 DIAGNOSIS — H6982 Other specified disorders of Eustachian tube, left ear: Secondary | ICD-10-CM | POA: Diagnosis not present

## 2021-07-15 DIAGNOSIS — Z23 Encounter for immunization: Secondary | ICD-10-CM | POA: Diagnosis not present

## 2021-08-23 DIAGNOSIS — M1711 Unilateral primary osteoarthritis, right knee: Secondary | ICD-10-CM | POA: Diagnosis not present

## 2021-09-17 ENCOUNTER — Other Ambulatory Visit: Payer: Self-pay | Admitting: Internal Medicine

## 2021-09-17 DIAGNOSIS — G453 Amaurosis fugax: Secondary | ICD-10-CM

## 2021-09-17 DIAGNOSIS — E785 Hyperlipidemia, unspecified: Secondary | ICD-10-CM

## 2021-09-30 DIAGNOSIS — Z23 Encounter for immunization: Secondary | ICD-10-CM | POA: Diagnosis not present

## 2021-10-13 DIAGNOSIS — H6982 Other specified disorders of Eustachian tube, left ear: Secondary | ICD-10-CM | POA: Diagnosis not present

## 2021-10-13 DIAGNOSIS — L299 Pruritus, unspecified: Secondary | ICD-10-CM | POA: Diagnosis not present

## 2021-10-26 DIAGNOSIS — H66003 Acute suppurative otitis media without spontaneous rupture of ear drum, bilateral: Secondary | ICD-10-CM | POA: Diagnosis not present

## 2021-10-26 DIAGNOSIS — H6982 Other specified disorders of Eustachian tube, left ear: Secondary | ICD-10-CM | POA: Diagnosis not present

## 2021-10-31 DIAGNOSIS — H0100A Unspecified blepharitis right eye, upper and lower eyelids: Secondary | ICD-10-CM | POA: Diagnosis not present

## 2021-10-31 DIAGNOSIS — H2511 Age-related nuclear cataract, right eye: Secondary | ICD-10-CM | POA: Diagnosis not present

## 2021-10-31 DIAGNOSIS — H0100B Unspecified blepharitis left eye, upper and lower eyelids: Secondary | ICD-10-CM | POA: Diagnosis not present

## 2021-10-31 DIAGNOSIS — H43393 Other vitreous opacities, bilateral: Secondary | ICD-10-CM | POA: Diagnosis not present

## 2021-10-31 DIAGNOSIS — H5371 Glare sensitivity: Secondary | ICD-10-CM | POA: Diagnosis not present

## 2021-10-31 DIAGNOSIS — H5203 Hypermetropia, bilateral: Secondary | ICD-10-CM | POA: Diagnosis not present

## 2021-10-31 DIAGNOSIS — H2513 Age-related nuclear cataract, bilateral: Secondary | ICD-10-CM | POA: Diagnosis not present

## 2021-10-31 DIAGNOSIS — H02834 Dermatochalasis of left upper eyelid: Secondary | ICD-10-CM | POA: Diagnosis not present

## 2021-10-31 DIAGNOSIS — H52223 Regular astigmatism, bilateral: Secondary | ICD-10-CM | POA: Diagnosis not present

## 2021-10-31 DIAGNOSIS — H02831 Dermatochalasis of right upper eyelid: Secondary | ICD-10-CM | POA: Diagnosis not present

## 2021-10-31 DIAGNOSIS — H524 Presbyopia: Secondary | ICD-10-CM | POA: Diagnosis not present

## 2021-11-16 DIAGNOSIS — H2511 Age-related nuclear cataract, right eye: Secondary | ICD-10-CM | POA: Diagnosis not present

## 2021-11-25 ENCOUNTER — Other Ambulatory Visit: Payer: Self-pay | Admitting: Internal Medicine

## 2021-11-25 DIAGNOSIS — I10 Essential (primary) hypertension: Secondary | ICD-10-CM

## 2021-11-25 DIAGNOSIS — G453 Amaurosis fugax: Secondary | ICD-10-CM

## 2021-11-25 DIAGNOSIS — H2512 Age-related nuclear cataract, left eye: Secondary | ICD-10-CM | POA: Diagnosis not present

## 2021-11-30 ENCOUNTER — Other Ambulatory Visit: Payer: Self-pay

## 2021-11-30 ENCOUNTER — Encounter: Payer: Self-pay | Admitting: Family Medicine

## 2021-11-30 ENCOUNTER — Encounter: Payer: Self-pay | Admitting: Internal Medicine

## 2021-11-30 ENCOUNTER — Ambulatory Visit (INDEPENDENT_AMBULATORY_CARE_PROVIDER_SITE_OTHER): Payer: Medicare Other | Admitting: Family Medicine

## 2021-11-30 VITALS — BP 128/74 | HR 100 | Temp 97.9°F | Ht 66.0 in | Wt 205.0 lb

## 2021-11-30 DIAGNOSIS — M159 Polyosteoarthritis, unspecified: Secondary | ICD-10-CM | POA: Diagnosis not present

## 2021-11-30 DIAGNOSIS — E785 Hyperlipidemia, unspecified: Secondary | ICD-10-CM

## 2021-11-30 DIAGNOSIS — I10 Essential (primary) hypertension: Secondary | ICD-10-CM | POA: Diagnosis not present

## 2021-11-30 MED ORDER — DICLOFENAC SODIUM 1 % EX GEL: 2.0000 g | Freq: Four times a day (QID) | CUTANEOUS | 1 refills | Status: DC

## 2021-11-30 MED ORDER — ROSUVASTATIN CALCIUM 20 MG PO TABS: 10.0000 mg | ORAL_TABLET | Freq: Every day | ORAL | 1 refills | Status: DC

## 2021-11-30 NOTE — Patient Instructions (Addendum)
It is ok to stop your blood pressure medication (telmisartan) and keep an eye on your blood pressure at home.  ?Keep a record of your blood pressure readings and bring these in to your next visit with Dr. Ronnald Ramp.  ?If your blood pressure is consistently 140/80 or higher, let us know sooner.  ? ?Continue your statin (rosuvastatin) for your cholesterol.  ? ?Cut back on meloxicam. Try using a topical pain medication called Voltaren gel on painful joints as prescribed.  ?You may also try over the counter Tylenol 500 mg as needed for pain (do not take more than 1,000 mg per day).  ? ?Follow up for fasting labs here tomorrow or Friday.  ? ?Return to see Dr. Ronnald Ramp in 6 weeks for recheck of blood pressure.  ? ? ?

## 2021-11-30 NOTE — Progress Notes (Signed)
? ?Subjective:  ? ? Patient ID: Connor Johnson, male    DOB: [birth year], 81 y.o.   MRN: [id number] ? ?HPI ?Chief Complaint  ?Patient presents with  ? Medication Refill  ?  Telmisartan, meloxicam, rosuvastatin. Pt would also like to discuss his meloxicam, he states he has been on it for so long he believes it isn't working as well  ? ?He is a patient of Dr. Ronnald Ramp and here today to see me for medication refills.  ? ?HTN- reports good compliance with olmesartan.  He does not check his blood pressure at home but he does get it checked weekly at his gym.  States his blood pressure has been lower than in the past consistent with today's reading.  He is interested in stopping his blood pressure medication. ? ?States he has been taking aspirin 81 mg daily since he had a TIA several years ago and he is afraid to stop this medication. ? ?Reports taking rosuvastatin daily without any concerns. ? ?States he has been taking meloxicam daily for years for arthritis.  States he does not have pain every day.  States his joints feel achy when the weather changes or on certain days.  States different joints hurt on different days.  States he does not think the medication is helpful any longer.  He does not take Tylenol or use any topical pain medication.  Denies pain today. ? ? ?Denies fever, chills, dizziness, chest pain, palpitations, shortness of breath, abdominal pain, N/V/D, urinary symptoms, LE edema.  ? ?Past Medical History:  ?Diagnosis Date  ? Arthritis   ? Cataract   ? COPD (chronic obstructive pulmonary disease) ([place])   ? Diverticulitis   ? Diverticulosis   ? HLD (hyperlipidemia)   ? Hypertension   ? TIA (transient ischemic attack) 2013  ? temporary loss of vision in L eye   ? ?Past Surgical History:  ?Procedure Laterality Date  ? COLONOSCOPY    ? PENILE PROSTHESIS IMPLANT  07/30/2006  ? Dr. Fenton Malling  ? ?Current Outpatient Medications on File Prior to Visit  ?Medication Sig Dispense Refill  ? aspirin 81 MG EC tablet TAKE 1  TABLET(81 MG) BY MOUTH DAILY. SWALLOW WHOLE 90 tablet 0  ? meloxicam (MOBIC) 15 MG tablet TAKE 1/2 TABLET BY MOUTH ONCE DAILY AS NEEDED 90 tablet 0  ? neomycin-polymyxin B (NEOSPORIN) SOLN neomycin-polymyxin-hydrocort 3.5 mg/mL-10,000 unit/mL-1 % ear solution ? INSTILL 4 DROPS INTO THE AFFECTED EAR(S) TWICE DAILY FOR 5 DAYS    ? ?No current facility-administered medications on file prior to visit.  ? ? ? ? ? ?Review of Systems ?Pertinent positives and negatives in the history of present illness. ? ?   ?Objective:  ? Physical Exam ?BP 128/74 (BP Location: Left Arm, Patient Position: Sitting, Cuff Size: Large)   Pulse 100   Temp 97.9 ?F (36.6 ?C) (Oral)   Ht '5\' 6"'$  (1.676 m)   Wt 205 lb (93 kg)   SpO2 98%   BMI 33.09 kg/m?  ? ?Alert and in no distress. Neck is supple with normal range of motion.  Cardiac exam shows a regular rhythm without murmurs or gallops. Lungs are clear to auscultation.  Extremities without edema.  Skin is warm and dry.  Normal speech, mood and behavior. ? ? ?   ?Assessment & Plan:  ?Primary hypertension - Plan: Comprehensive metabolic panel, CBC ?-He will stop telmisartan for now.  Advised him to keep a eye on his blood pressure at home, keep a list of  the readings and bring those into his next visit with his PCP, Dr. Ronnald Ramp.  Discussed a low-sodium diet. ?He will report back if his blood pressure is consistently higher than 140/80. ? ?He is not fasting.  He will follow-up tomorrow morning for fasting labs.  Orders are in the computer ? ? ?Hyperlipidemia with target LDL less than 70 - Plan: Lipid panel, rosuvastatin (CRESTOR) 20 MG tablet ?-Continue statin therapy.  Reviewed labs including lipid panel with patient and his LDL is well controlled. ? ?Generalized OA - Plan: diclofenac Sodium (VOLTAREN) 1 % GEL ?-Discussed risk versus benefit of NSAIDs in his age group.  He will avoid taking meloxicam daily.  Prescribed Voltaren gel or he may get this over-the-counter to try on painful joints as  needed.  Discussed also trying Tylenol for mild pain.  Use meloxicam as needed. ? ? ?

## 2021-12-01 ENCOUNTER — Other Ambulatory Visit (INDEPENDENT_AMBULATORY_CARE_PROVIDER_SITE_OTHER): Payer: Medicare Other

## 2021-12-01 DIAGNOSIS — I10 Essential (primary) hypertension: Secondary | ICD-10-CM | POA: Diagnosis not present

## 2021-12-01 DIAGNOSIS — E785 Hyperlipidemia, unspecified: Secondary | ICD-10-CM | POA: Diagnosis not present

## 2021-12-01 LAB — LIPID PANEL
Cholesterol: 136 mg/dL (ref 0–200)
HDL: 62 mg/dL (ref >39.00)
LDL Cholesterol: 57 mg/dL (ref 0–99)
NonHDL: 74.03
Total CHOL/HDL Ratio: 2
Triglycerides: 86 mg/dL (ref 0.0–149.0)
VLDL: 17.2 mg/dL (ref 0.0–40.0)

## 2021-12-01 LAB — COMPREHENSIVE METABOLIC PANEL
ALT: 14 U/L (ref 0–53)
AST: 15 U/L (ref 0–37)
Albumin: 4.2 g/dL (ref 3.5–5.2)
Alkaline Phosphatase: 47 U/L (ref 39–117)
BUN: 21 mg/dL (ref 6–23)
CO2: 30 mEq/L (ref 19–32)
Calcium: 8.8 mg/dL (ref 8.4–10.5)
Chloride: 103 mEq/L (ref 96–112)
Creatinine, Ser: 0.94 mg/dL (ref 0.40–1.50)
GFR: 77.45 mL/min (ref >60.00)
Glucose, Bld: 106 mg/dL — ABNORMAL HIGH (ref 70–99)
Potassium: 4.5 mEq/L (ref 3.5–5.1)
Sodium: 138 mEq/L (ref 135–145)
Total Bilirubin: 0.8 mg/dL (ref 0.2–1.2)
Total Protein: 6.6 g/dL (ref 6.0–8.3)

## 2021-12-01 LAB — CBC
HCT: 44.3 % (ref 39.0–52.0)
Hemoglobin: 14.9 g/dL (ref 13.0–17.0)
MCHC: 33.5 g/dL (ref 30.0–36.0)
MCV: 95.5 fl (ref 78.0–100.0)
Platelets: 250 10*3/uL (ref 150.0–400.0)
RBC: 4.64 Mil/uL (ref 4.22–5.81)
RDW: 12.9 % (ref 11.5–15.5)
WBC: 6.3 10*3/uL (ref 4.0–10.5)

## 2021-12-01 NOTE — Progress Notes (Signed)
His blood work is good.

## 2021-12-07 DIAGNOSIS — H2512 Age-related nuclear cataract, left eye: Secondary | ICD-10-CM | POA: Diagnosis not present

## 2022-01-01 ENCOUNTER — Other Ambulatory Visit: Payer: Self-pay | Admitting: Internal Medicine

## 2022-01-01 DIAGNOSIS — M159 Polyosteoarthritis, unspecified: Secondary | ICD-10-CM

## 2022-01-02 DIAGNOSIS — H6982 Other specified disorders of Eustachian tube, left ear: Secondary | ICD-10-CM | POA: Diagnosis not present

## 2022-01-02 DIAGNOSIS — H6622 Chronic atticoantral suppurative otitis media, left ear: Secondary | ICD-10-CM | POA: Diagnosis not present

## 2022-01-17 ENCOUNTER — Encounter: Payer: Self-pay | Admitting: Internal Medicine

## 2022-01-17 ENCOUNTER — Ambulatory Visit (INDEPENDENT_AMBULATORY_CARE_PROVIDER_SITE_OTHER): Payer: Medicare Other | Admitting: Internal Medicine

## 2022-01-17 ENCOUNTER — Telehealth: Payer: Self-pay | Admitting: Internal Medicine

## 2022-01-17 VITALS — BP 140/86 | HR 92 | Temp 98.2°F | Ht 66.0 in | Wt 203.0 lb

## 2022-01-17 DIAGNOSIS — I10 Essential (primary) hypertension: Secondary | ICD-10-CM

## 2022-01-17 DIAGNOSIS — R7303 Prediabetes: Secondary | ICD-10-CM

## 2022-01-17 DIAGNOSIS — Z23 Encounter for immunization: Secondary | ICD-10-CM

## 2022-01-17 LAB — HEMOGLOBIN A1C: Hgb A1c MFr Bld: 5.9 % (ref 4.6–6.5)

## 2022-01-17 MED ORDER — SHINGRIX 50 MCG/0.5ML IM SUSR: 0.5000 mL | Freq: Once | INTRAMUSCULAR | 1 refills | Status: DC

## 2022-01-17 NOTE — Patient Instructions (Signed)
Hypertension, Adult High blood pressure (hypertension) is when the force of blood pumping through the arteries is too strong. The arteries are the blood vessels that carry blood from the heart throughout the body. Hypertension forces the heart to work harder to pump blood and may cause arteries to become narrow or stiff. Untreated or uncontrolled hypertension can lead to a heart attack, heart failure, a stroke, kidney disease, and other problems. A blood pressure reading consists of a higher number over a lower number. Ideally, your blood pressure should be below 120/80. The first ("top") number is called the systolic pressure. It is a measure of the pressure in your arteries as your heart beats. The second ("bottom") number is called the diastolic pressure. It is a measure of the pressure in your arteries as the heart relaxes. What are the causes? The exact cause of this condition is not known. There are some conditions that result in high blood pressure. What increases the risk? Certain factors may make you more likely to develop high blood pressure. Some of these risk factors are under your control, including: Smoking. Not getting enough exercise or physical activity. Being overweight. Having too much fat, sugar, calories, or salt (sodium) in your diet. Drinking too much alcohol. Other risk factors include: Having a personal history of heart disease, diabetes, high cholesterol, or kidney disease. Stress. Having a family history of high blood pressure and high cholesterol. Having obstructive sleep apnea. Age. The risk increases with age. What are the signs or symptoms? High blood pressure may not cause symptoms. Very high blood pressure (hypertensive crisis) may cause: Headache. Fast or irregular heartbeats (palpitations). Shortness of breath. Nosebleed. Nausea and vomiting. Vision changes. Severe chest pain, dizziness, and seizures. How is this diagnosed? This condition is diagnosed by  measuring your blood pressure while you are seated, with your arm resting on a flat surface, your legs uncrossed, and your feet flat on the floor. The cuff of the blood pressure monitor will be placed directly against the skin of your upper arm at the level of your heart. Blood pressure should be measured at least twice using the same arm. Certain conditions can cause a difference in blood pressure between your right and left arms. If you have a high blood pressure reading during one visit or you have normal blood pressure with other risk factors, you may be asked to: Return on a different day to have your blood pressure checked again. Monitor your blood pressure at home for 1 week or longer. If you are diagnosed with hypertension, you may have other blood or imaging tests to help your health care provider understand your overall risk for other conditions. How is this treated? This condition is treated by making healthy lifestyle changes, such as eating healthy foods, exercising more, and reducing your alcohol intake. You may be referred for counseling on a healthy diet and physical activity. Your health care provider may prescribe medicine if lifestyle changes are not enough to get your blood pressure under control and if: Your systolic blood pressure is above 130. Your diastolic blood pressure is above 80. Your personal target blood pressure may vary depending on your medical conditions, your age, and other factors. Follow these instructions at home: Eating and drinking  Eat a diet that is high in fiber and potassium, and low in sodium, added sugar, and fat. An example of this eating plan is called the DASH diet. DASH stands for Dietary Approaches to Stop Hypertension. To eat this way: Eat   plenty of fresh fruits and vegetables. Try to fill one half of your plate at each meal with fruits and vegetables. Eat whole grains, such as whole-wheat pasta, brown rice, or whole-grain bread. Fill about one  fourth of your plate with whole grains. Eat or drink low-fat dairy products, such as skim milk or low-fat yogurt. Avoid fatty cuts of meat, processed or cured meats, and poultry with skin. Fill about one fourth of your plate with lean proteins, such as fish, chicken without skin, beans, eggs, or tofu. Avoid pre-made and processed foods. These tend to be higher in sodium, added sugar, and fat. Reduce your daily sodium intake. Many people with hypertension should eat less than 1,500 mg of sodium a day. Do not drink alcohol if: Your health care provider tells you not to drink. You are pregnant, may be pregnant, or are planning to become pregnant. If you drink alcohol: Limit how much you have to: 0-1 drink a day for women. 0-2 drinks a day for men. Know how much alcohol is in your drink. In the [place] one drink equals one 12 oz bottle of beer (355 mL), one 5 oz glass of wine (148 mL), or one 1 oz glass of hard liquor (44 mL). Lifestyle  Work with your health care provider to maintain a healthy body weight or to lose weight. Ask what an ideal weight is for you. Get at least 30 minutes of exercise that causes your heart to beat faster (aerobic exercise) most days of the week. Activities may include walking, swimming, or biking. Include exercise to strengthen your muscles (resistance exercise), such as Pilates or lifting weights, as part of your weekly exercise routine. Try to do these types of exercises for 30 minutes at least 3 days a week. Do not use any products that contain nicotine or tobacco. These products include cigarettes, chewing tobacco, and vaping devices, such as e-cigarettes. If you need help quitting, ask your health care provider. Monitor your blood pressure at home as told by your health care provider. Keep all follow-up visits. This is important. Medicines Take over-the-counter and prescription medicines only as told by your health care provider. Follow directions carefully. Blood  pressure medicines must be taken as prescribed. Do not skip doses of blood pressure medicine. Doing this puts you at risk for problems and can make the medicine less effective. Ask your health care provider about side effects or reactions to medicines that you should watch for. Contact a health care provider if you: Think you are having a reaction to a medicine you are taking. Have headaches that keep coming back (recurring). Feel dizzy. Have swelling in your ankles. Have trouble with your vision. Get help right away if you: Develop a severe headache or confusion. Have unusual weakness or numbness. Feel faint. Have severe pain in your chest or abdomen. Vomit repeatedly. Have trouble breathing. These symptoms may be an emergency. Get help right away. Call 911. Do not wait to see if the symptoms will go away. Do not drive yourself to the hospital. Summary Hypertension is when the force of blood pumping through your arteries is too strong. If this condition is not controlled, it may put you at risk for serious complications. Your personal target blood pressure may vary depending on your medical conditions, your age, and other factors. For most people, a normal blood pressure is less than 120/80. Hypertension is treated with lifestyle changes, medicines, or a combination of both. Lifestyle changes include losing weight, eating a healthy,   low-sodium diet, exercising more, and limiting alcohol. This information is not intended to replace advice given to you by your health care provider. Make sure you discuss any questions you have with your health care provider. Document Revised: 07/05/2021 Document Reviewed: 07/05/2021 Elsevier Patient Education  2023 [employer]  

## 2022-01-17 NOTE — Progress Notes (Signed)
? ?Subjective:  ?Patient ID: Connor Johnson, male    DOB: 1940-12-05  Age: 81 y.o. MRN: [id number] ? ?CC: Hypertension ? ? ?HPI ?Connor Johnson presents for f/up - ? ?He does cardiovascular exercise and does not experience chest pain, shortness of breath, diaphoresis, dizziness, lightheadedness, or edema. ? ?Outpatient Medications Prior to Visit  ?Medication Sig Dispense Refill  ? aspirin 81 MG EC tablet TAKE 1 TABLET(81 MG) BY MOUTH DAILY. SWALLOW WHOLE 90 tablet 0  ? diclofenac Sodium (VOLTAREN) 1 % GEL Apply 2 g topically 4 (four) times daily. 100 g 1  ? neomycin-polymyxin B (NEOSPORIN) SOLN neomycin-polymyxin-hydrocort 3.5 mg/mL-10,000 unit/mL-1 % ear solution ? INSTILL 4 DROPS INTO THE AFFECTED EAR(S) TWICE DAILY FOR 5 DAYS    ? rosuvastatin (CRESTOR) 20 MG tablet Take 0.5 tablets (10 mg total) by mouth daily. 45 tablet 1  ? meloxicam (MOBIC) 15 MG tablet TAKE 1/2 TABLET BY MOUTH ONCE DAILY AS NEEDED 90 tablet 0  ? ?No facility-administered medications prior to visit.  ? ? ?ROS ?Review of Systems  ?Constitutional:  Negative for chills, diaphoresis, fatigue and fever.  ?HENT: Negative.    ?Eyes: Negative.   ?Respiratory:  Negative for cough, chest tightness, shortness of breath and wheezing.   ?Cardiovascular:  Negative for chest pain, palpitations and leg swelling.  ?Gastrointestinal:  Negative for abdominal pain, diarrhea, nausea and vomiting.  ?Endocrine: Negative.   ?Genitourinary: Negative.  Negative for difficulty urinating.  ?Musculoskeletal:  Positive for arthralgias and joint swelling. Negative for myalgias.  ?Skin: Negative.   ?Neurological:  Negative for dizziness, weakness and headaches.  ?Hematological:  Negative for adenopathy. Does not bruise/bleed easily.  ?Psychiatric/Behavioral: Negative.    ? ?Objective:  ?BP 140/86 (BP Location: Left Arm, Patient Position: Sitting, Cuff Size: Large)   Pulse 92   Temp 98.2 ?F (36.8 ?C) (Oral)   Ht '5\' 6"'$  (1.676 m)   Wt 203 lb (92.1 kg)   SpO2 97%   BMI 32.77 kg/m?   ? ?BP Readings from Last 3 Encounters:  ?01/17/22 140/86  ?11/30/21 128/74  ?03/15/21 134/82  ? ? ?Wt Readings from Last 3 Encounters:  ?01/17/22 203 lb (92.1 kg)  ?11/30/21 205 lb (93 kg)  ?03/15/21 210 lb (95.3 kg)  ? ? ?Physical Exam ?Vitals reviewed.  ?HENT:  ?   Mouth/Throat:  ?   Mouth: Mucous membranes are moist.  ?Eyes:  ?   General: No scleral icterus. ?   Conjunctiva/sclera: Conjunctivae normal.  ?Cardiovascular:  ?   Rate and Rhythm: Normal rate and regular rhythm.  ?   Heart sounds: No murmur heard. ?Pulmonary:  ?   Effort: Pulmonary effort is normal.  ?   Breath sounds: No stridor. No wheezing, rhonchi or rales.  ?Abdominal:  ?   General: Abdomen is flat.  ?   Palpations: There is no mass.  ?   Tenderness: There is no abdominal tenderness. There is no guarding.  ?Musculoskeletal:     ?   General: Deformity (djd) present. No swelling.  ?   Cervical back: Neck supple.  ?   Right lower leg: No edema.  ?Lymphadenopathy:  ?   Cervical: No cervical adenopathy.  ?Skin: ?   General: Skin is warm and dry.  ?Neurological:  ?   General: No focal deficit present.  ?   Mental Status: He is alert.  ?Psychiatric:     ?   Mood and Affect: Mood normal.     ?   Behavior: Behavior normal.  ? ? ?  Lab Results  ?Component Value Date  ? WBC 6.3 12/01/2021  ? HGB 14.9 12/01/2021  ? HCT 44.3 12/01/2021  ? PLT 250.0 12/01/2021  ? GLUCOSE 106 (H) 12/01/2021  ? CHOL 136 12/01/2021  ? TRIG 86.0 12/01/2021  ? HDL 62.00 12/01/2021  ? Connor Johnson 59 12/01/2021  ? ALT 14 12/01/2021  ? AST 15 12/01/2021  ? NA 138 12/01/2021  ? K 4.5 12/01/2021  ? CL 103 12/01/2021  ? CREATININE 0.94 12/01/2021  ? BUN 21 12/01/2021  ? CO2 30 12/01/2021  ? TSH 1.28 11/19/2019  ? PSA 1.91 11/19/2019  ? HGBA1C 5.9 01/17/2022  ? ? ?MR BRAIN W WO CONTRAST ? ?Result Date: 11/01/2018 ? [facility] [address], [place], [place] [address] [phone] NEUROIMAGING REPORT STUDY DATE: 10/31/2018 PATIENT NAME: Connor Johnson DOB: 20-Jul-1941 MRN:  [id number] EXAM: MRI Brain with and without contrast ORDERING CLINICIAN: Sarina Ill, MD CLINICAL HISTORY: 81 year old man with encephalopathy, memory loss and confusion COMPARISON FILMS: MRI 06/10/2009 TECHNIQUE:MRI of the brain with and without contrast was obtained utilizing 5 mm axial slices with T1, T2, T2 flair, SWI and diffusion weighted views.  T1 sagittal, T2 coronal and postcontrast views in the axial and coronal plane were obtained. CONTRAST: 20 ml [employer] IMAGING SITE: [facility], [place]. FINDINGS: On sagittal images, the spinal cord is imaged caudally to C3 and is normal in caliber.   The contents of the posterior fossa are of normal size and position.   The pituitary gland and optic chiasm appear normal.    There is mild to moderate generalized cortical atrophy that has progressed compared to the 2010 MRI.  There are no abnormal extra-axial collections of fluid.  There are a couple small T2/flair hyperintense foci in the pons and one in the right cerebellar hemisphere.  The deep gray matter appears normal.  In the hemispheres there are single and confluent T2/flair hyperintense foci with the largest confluencies in the periatrial white matter.  The white matter changes have only slightly changed compared to the 2010 MRI.  Diffusion weighted images are normal.  Susceptibility weighted images are normal.   The orbits appear normal.   The VIIth/VIIIth nerve complex appears normal.  There is a left mastoid effusion.  The right mastoid air cells appear normal.  The paranasal sinuses appear normal.  Flow voids are identified within the major intracerebral arteries.  After the infusion of contrast material, a normal enhancement pattern is noted.  ? ?This MRI of the brain with and without contrast shows the following: 1.    Mild to moderate generalized cortical atrophy progressed compared to the 2010 MRI. 2.    White matter changes most consistent with moderately severe chronic  microvascular ischemic change with large confluencies in the periatrial white matter.  This has just slightly progressed compared to the 2010 MRI. 3.    Left mastoid effusion, likely due to eustachian tube dysfunction INTERPRETING PHYSICIAN: Richard A. Felecia Shelling, MD, PhD, FAAN Certified in  [place] by [employer]  ? ? ?Assessment & Plan:  ? ?Connor Johnson was seen today for hypertension. ? ?Diagnoses and all orders for this visit: ? ?Primary hypertension- His blood pressure is adequately well controlled.  Recent electrolytes and renal function were normal. ? ?Prediabetes- His A1c is at 5.9%.  Medical therapy is not indicated. ?-     Hemoglobin A1c; Future ?-     Hemoglobin A1c ? ?Need for prophylactic vaccination and inoculation against varicella ?-  Discontinue: Zoster Vaccine Adjuvanted [facility]) injection; Inject 0.5 mLs into the muscle once for 1 dose. ?-     Zoster Vaccine Adjuvanted [facility]) injection; Inject 0.5 mLs into the muscle once for 1 dose. ? ? ?I have discontinued Connor Johnson's meloxicam. I am also having him maintain his neomycin-polymyxin B, aspirin, rosuvastatin, diclofenac Sodium, and Shingrix. ? ?Meds ordered this encounter  ?Medications  ? DISCONTD: Zoster Vaccine Adjuvanted [facility]) injection  ?  Sig: Inject 0.5 mLs into the muscle once for 1 dose.  ?  Dispense:  0.5 mL  ?  Refill:  1  ? Zoster Vaccine Adjuvanted [facility]) injection  ?  Sig: Inject 0.5 mLs into the muscle once for 1 dose.  ?  Dispense:  0.5 mL  ?  Refill:  1  ? ? ? ?Follow-up: Return in about 6 months (around 07/20/2022). ? ?Scarlette Calico, MD ?

## 2022-01-17 NOTE — Telephone Encounter (Signed)
PT visits today to let us know that he needed the shingles vaccine prescription sent out to the [facility] in [facility]. Address [address] [place] [address] ?

## 2022-01-18 MED ORDER — SHINGRIX 50 MCG/0.5ML IM SUSR: 0.5000 mL | Freq: Once | INTRAMUSCULAR | 1 refills | Status: AC

## 2022-03-19 ENCOUNTER — Other Ambulatory Visit: Payer: Self-pay | Admitting: Internal Medicine

## 2022-03-19 DIAGNOSIS — E785 Hyperlipidemia, unspecified: Secondary | ICD-10-CM

## 2022-03-22 DIAGNOSIS — H66003 Acute suppurative otitis media without spontaneous rupture of ear drum, bilateral: Secondary | ICD-10-CM | POA: Diagnosis not present

## 2022-03-22 DIAGNOSIS — H6982 Other specified disorders of Eustachian tube, left ear: Secondary | ICD-10-CM | POA: Diagnosis not present

## 2022-05-23 DIAGNOSIS — H6191 Disorder of right external ear, unspecified: Secondary | ICD-10-CM | POA: Diagnosis not present

## 2022-05-23 DIAGNOSIS — H6641 Suppurative otitis media, unspecified, right ear: Secondary | ICD-10-CM | POA: Insufficient documentation

## 2022-05-23 DIAGNOSIS — H7492 Unspecified disorder of left middle ear and mastoid: Secondary | ICD-10-CM | POA: Diagnosis not present

## 2022-05-23 DIAGNOSIS — H6642 Suppurative otitis media, unspecified, left ear: Secondary | ICD-10-CM | POA: Diagnosis not present

## 2022-06-15 ENCOUNTER — Encounter: Payer: Self-pay | Admitting: Internal Medicine

## 2022-06-15 ENCOUNTER — Ambulatory Visit (INDEPENDENT_AMBULATORY_CARE_PROVIDER_SITE_OTHER): Payer: Medicare Other

## 2022-06-15 ENCOUNTER — Ambulatory Visit (INDEPENDENT_AMBULATORY_CARE_PROVIDER_SITE_OTHER): Payer: Medicare Other | Admitting: Internal Medicine

## 2022-06-15 VITALS — BP 138/82 | HR 92 | Temp 98.3°F | Ht 66.0 in | Wt 198.0 lb

## 2022-06-15 DIAGNOSIS — R61 Generalized hyperhidrosis: Secondary | ICD-10-CM | POA: Diagnosis not present

## 2022-06-15 DIAGNOSIS — R052 Subacute cough: Secondary | ICD-10-CM | POA: Diagnosis not present

## 2022-06-15 DIAGNOSIS — U071 COVID-19: Secondary | ICD-10-CM | POA: Insufficient documentation

## 2022-06-15 DIAGNOSIS — J22 Unspecified acute lower respiratory infection: Secondary | ICD-10-CM | POA: Diagnosis not present

## 2022-06-15 DIAGNOSIS — J439 Emphysema, unspecified: Secondary | ICD-10-CM | POA: Diagnosis not present

## 2022-06-15 MED ORDER — HYDROCODONE BIT-HOMATROP MBR 5-1.5 MG/5ML PO SOLN: 5.0000 mL | Freq: Three times a day (TID) | ORAL | 0 refills | Status: AC | PRN

## 2022-06-15 MED ORDER — AMOXICILLIN-POT CLAVULANATE 875-125 MG PO TABS: 1.0000 | ORAL_TABLET | Freq: Two times a day (BID) | ORAL | 0 refills | Status: AC

## 2022-06-15 NOTE — Patient Instructions (Signed)

## 2022-06-15 NOTE — Progress Notes (Signed)
Subjective:  Patient ID: Connor Johnson, male    DOB: 28-Jun-1941  Age: 81 y.o. MRN: [id number]  CC: Cough   HPI Connor Johnson presents for cough -  He complains of a 3-week history of cough productive of yellow phlegm with night sweats.  He tested positive for COVID 6 days ago.  He denies shortness of breath, wheezing, chest pain, fever, or chills.  Outpatient Medications Prior to Visit  Medication Sig Dispense Refill   aspirin 81 MG EC tablet TAKE 1 TABLET(81 MG) BY MOUTH DAILY. SWALLOW WHOLE 90 tablet 0   diclofenac Sodium (VOLTAREN) 1 % GEL Apply 2 g topically 4 (four) times daily. 100 g 1   neomycin-polymyxin B (NEOSPORIN) SOLN neomycin-polymyxin-hydrocort 3.5 mg/mL-10,000 unit/mL-1 % ear solution  INSTILL 4 DROPS INTO THE AFFECTED EAR(S) TWICE DAILY FOR 5 DAYS     rosuvastatin (CRESTOR) 20 MG tablet TAKE 1/2 TABLET BY MOUTH EVERY DAY 45 tablet 1   No facility-administered medications prior to visit.    ROS Review of Systems  Constitutional:  Negative for chills, diaphoresis, fatigue and fever.  HENT: Negative.    Eyes: Negative.   Respiratory:  Positive for cough. Negative for chest tightness, shortness of breath and wheezing.   Cardiovascular:  Negative for chest pain, palpitations and leg swelling.  Gastrointestinal:  Negative for abdominal pain, diarrhea, nausea and vomiting.  Endocrine: Negative.   Genitourinary: Negative.  Negative for difficulty urinating.  Musculoskeletal: Negative.  Negative for arthralgias, joint swelling and myalgias.  Skin: Negative.   Neurological: Negative.  Negative for dizziness and weakness.  Hematological:  Negative for adenopathy. Does not bruise/bleed easily.  Psychiatric/Behavioral: Negative.      Objective:  BP 138/82 (BP Location: Left Arm, Patient Position: Sitting, Cuff Size: Large)   Pulse 92   Temp 98.3 F (36.8 C) (Oral)   Ht '5\' 6"'$  (1.676 m)   Wt 198 lb (89.8 kg)   SpO2 96%   BMI 31.96 kg/m   BP Readings from Last 3  Encounters:  06/15/22 138/82  01/17/22 140/86  11/30/21 128/74    Wt Readings from Last 3 Encounters:  06/15/22 198 lb (89.8 kg)  01/17/22 203 lb (92.1 kg)  11/30/21 205 lb (93 kg)    Physical Exam Vitals reviewed.  HENT:     Nose: Nose normal.     Mouth/Throat:     Mouth: Mucous membranes are moist.  Eyes:     General: No scleral icterus.    Conjunctiva/sclera: Conjunctivae normal.  Cardiovascular:     Rate and Rhythm: Normal rate and regular rhythm.     Heart sounds: No murmur heard. Pulmonary:     Effort: Pulmonary effort is normal.     Breath sounds: No stridor. No wheezing, rhonchi or rales.  Abdominal:     General: Abdomen is flat.     Palpations: There is no mass.     Tenderness: There is no abdominal tenderness. There is no guarding.     Hernia: No hernia is present.  Musculoskeletal:        General: Normal range of motion.     Cervical back: Neck supple.     Right lower leg: No edema.     Left lower leg: No edema.  Lymphadenopathy:     Cervical: No cervical adenopathy.  Skin:    General: Skin is warm and dry.  Neurological:     General: No focal deficit present.     Mental Status: He is alert.  Psychiatric:  Mood and Affect: Mood normal.        Behavior: Behavior normal.     Lab Results  Component Value Date   WBC 6.3 12/01/2021   HGB 14.9 12/01/2021   HCT 44.3 12/01/2021   PLT 250.0 12/01/2021   GLUCOSE 106 (H) 12/01/2021   CHOL 136 12/01/2021   TRIG 86.0 12/01/2021   HDL 62.00 12/01/2021   LDLCALC 57 12/01/2021   ALT 14 12/01/2021   AST 15 12/01/2021   NA 138 12/01/2021   K 4.5 12/01/2021   CL 103 12/01/2021   CREATININE 0.94 12/01/2021   BUN 21 12/01/2021   CO2 30 12/01/2021   TSH 1.28 11/19/2019   PSA 1.91 11/19/2019   HGBA1C 5.9 01/17/2022    MR BRAIN W WO CONTRAST  Result Date: 11/01/2018  [facility] [address] [place], [place] [address] [phone] NEUROIMAGING REPORT STUDY DATE:  10/31/2018 PATIENT NAME: Connor Johnson DOB: 12/19/1940 MRN: [id number] EXAM: MRI Brain with and without contrast ORDERING CLINICIAN: Sarina Ill, MD CLINICAL HISTORY: 81 year old man with encephalopathy, memory loss and confusion COMPARISON FILMS: MRI 06/10/2009 TECHNIQUE:MRI of the brain with and without contrast was obtained utilizing 5 mm axial slices with T1, T2, T2 flair, SWI and diffusion weighted views.  T1 sagittal, T2 coronal and postcontrast views in the axial and coronal plane were obtained. CONTRAST: 20 ml [employer] IMAGING SITE: [facility], [place]. FINDINGS: On sagittal images, the spinal cord is imaged caudally to C3 and is normal in caliber.   The contents of the posterior fossa are of normal size and position.   The pituitary gland and optic chiasm appear normal.    There is mild to moderate generalized cortical atrophy that has progressed compared to the 2010 MRI.  There are no abnormal extra-axial collections of fluid.  There are a couple small T2/flair hyperintense foci in the pons and one in the right cerebellar hemisphere.  The deep gray matter appears normal.  In the hemispheres there are single and confluent T2/flair hyperintense foci with the largest confluencies in the periatrial white matter.  The white matter changes have only slightly changed compared to the 2010 MRI.  Diffusion weighted images are normal.  Susceptibility weighted images are normal.   The orbits appear normal.   The VIIth/VIIIth nerve complex appears normal.  There is a left mastoid effusion.  The right mastoid air cells appear normal.  The paranasal sinuses appear normal.  Flow voids are identified within the major intracerebral arteries.  After the infusion of contrast material, a normal enhancement pattern is noted.   This MRI of the brain with and without contrast shows the following: 1.    Mild to moderate generalized cortical atrophy progressed compared to the 2010 MRI. 2.    White matter changes  most consistent with moderately severe chronic microvascular ischemic change with large confluencies in the periatrial white matter.  This has just slightly progressed compared to the 2010 MRI. 3.    Left mastoid effusion, likely due to eustachian tube dysfunction INTERPRETING PHYSICIAN: Richard A. Felecia Shelling, MD, PhD, FAAN Certified in  Neuroimaging by [employer]    DG Chest 2 View  Result Date: 06/15/2022 CLINICAL DATA:  Three-week history of cough. EXAM: CHEST - 2 VIEW COMPARISON:  03/15/2021 FINDINGS: The cardiac silhouette, mediastinal and hilar contours are within normal limits the unstable. The lungs demonstrate stable emphysematous changes pulmonary scarring but no acute overlying pulmonary process. No pleural effusions or pulmonary  lesions. The bony thorax is intact. IMPRESSION: Emphysematous changes and pulmonary scarring but no acute overlying pulmonary process. Electronically Signed   By: Marijo Sanes M.D.   On: 06/15/2022 14:01     Assessment & Plan:   Nikolos was seen today for cough.  Diagnoses and all orders for this visit:  Subacute cough- Chest x-ray is negative for mass or infiltrates. -     DG Chest 2 View; Future -     HYDROcodone bit-homatropine (HYCODAN) 5-1.5 MG/5ML syrup; Take 5 mLs by mouth every 8 (eight) hours as needed for up to 7 days for cough.  Night sweats -     DG Chest 2 View; Future  LRTI (lower respiratory tract infection) -     amoxicillin-clavulanate (AUGMENTIN) 875-125 MG tablet; Take 1 tablet by mouth 2 (two) times daily for 7 days. -     HYDROcodone bit-homatropine (HYCODAN) 5-1.5 MG/5ML syrup; Take 5 mLs by mouth every 8 (eight) hours as needed for up to 7 days for cough.  COVID- He has presented too late to benefit from antivirals.   I am having Connor Johnson start on amoxicillin-clavulanate and HYDROcodone bit-homatropine. I am also having him maintain his neomycin-polymyxin B, aspirin EC, diclofenac Sodium, and rosuvastatin.  Meds  ordered this encounter  Medications   amoxicillin-clavulanate (AUGMENTIN) 875-125 MG tablet    Sig: Take 1 tablet by mouth 2 (two) times daily for 7 days.    Dispense:  14 tablet    Refill:  0   HYDROcodone bit-homatropine (HYCODAN) 5-1.5 MG/5ML syrup    Sig: Take 5 mLs by mouth every 8 (eight) hours as needed for up to 7 days for cough.    Dispense:  120 mL    Refill:  0     Follow-up: Return in about 3 weeks (around 07/06/2022).  Scarlette Calico, MD

## 2022-06-27 DIAGNOSIS — H6991 Unspecified Eustachian tube disorder, right ear: Secondary | ICD-10-CM | POA: Diagnosis not present

## 2022-06-27 DIAGNOSIS — H6191 Disorder of right external ear, unspecified: Secondary | ICD-10-CM | POA: Diagnosis not present

## 2022-07-04 ENCOUNTER — Other Ambulatory Visit: Payer: Self-pay | Admitting: Internal Medicine

## 2022-07-04 DIAGNOSIS — H6981 Other specified disorders of Eustachian tube, right ear: Secondary | ICD-10-CM | POA: Diagnosis not present

## 2022-07-17 DIAGNOSIS — Z23 Encounter for immunization: Secondary | ICD-10-CM | POA: Diagnosis not present

## 2022-07-20 DIAGNOSIS — H6992 Unspecified Eustachian tube disorder, left ear: Secondary | ICD-10-CM | POA: Diagnosis not present

## 2022-07-20 DIAGNOSIS — H6991 Unspecified Eustachian tube disorder, right ear: Secondary | ICD-10-CM | POA: Diagnosis not present

## 2022-07-20 DIAGNOSIS — H6993 Unspecified Eustachian tube disorder, bilateral: Secondary | ICD-10-CM | POA: Diagnosis not present

## 2022-08-01 DIAGNOSIS — H6642 Suppurative otitis media, unspecified, left ear: Secondary | ICD-10-CM | POA: Diagnosis not present

## 2022-08-01 DIAGNOSIS — H6993 Unspecified Eustachian tube disorder, bilateral: Secondary | ICD-10-CM | POA: Diagnosis not present

## 2022-08-24 DIAGNOSIS — H6983 Other specified disorders of Eustachian tube, bilateral: Secondary | ICD-10-CM | POA: Diagnosis not present

## 2022-10-14 ENCOUNTER — Other Ambulatory Visit: Payer: Self-pay | Admitting: Internal Medicine

## 2022-10-14 DIAGNOSIS — E785 Hyperlipidemia, unspecified: Secondary | ICD-10-CM

## 2022-10-25 DIAGNOSIS — H6643 Suppurative otitis media, unspecified, bilateral: Secondary | ICD-10-CM | POA: Diagnosis not present

## 2023-01-30 DIAGNOSIS — H6643 Suppurative otitis media, unspecified, bilateral: Secondary | ICD-10-CM | POA: Diagnosis not present

## 2023-02-02 ENCOUNTER — Telehealth: Payer: Self-pay | Admitting: Radiology

## 2023-02-02 NOTE — Telephone Encounter (Signed)
Unable to LVM for pt to schedule for Medicare annual wellness visit.  Please schedule pt.   Last AWV: 10/08/20  Abhijay Morriss K. CMA

## 2023-02-22 DIAGNOSIS — H0100B Unspecified blepharitis left eye, upper and lower eyelids: Secondary | ICD-10-CM | POA: Diagnosis not present

## 2023-02-22 DIAGNOSIS — H43393 Other vitreous opacities, bilateral: Secondary | ICD-10-CM | POA: Diagnosis not present

## 2023-02-22 DIAGNOSIS — H0100A Unspecified blepharitis right eye, upper and lower eyelids: Secondary | ICD-10-CM | POA: Diagnosis not present

## 2023-02-22 DIAGNOSIS — H02834 Dermatochalasis of left upper eyelid: Secondary | ICD-10-CM | POA: Diagnosis not present

## 2023-02-22 DIAGNOSIS — R7303 Prediabetes: Secondary | ICD-10-CM | POA: Diagnosis not present

## 2023-02-22 DIAGNOSIS — H02831 Dermatochalasis of right upper eyelid: Secondary | ICD-10-CM | POA: Diagnosis not present

## 2023-02-22 DIAGNOSIS — H04123 Dry eye syndrome of bilateral lacrimal glands: Secondary | ICD-10-CM | POA: Diagnosis not present

## 2023-02-22 DIAGNOSIS — Z961 Presence of intraocular lens: Secondary | ICD-10-CM | POA: Diagnosis not present

## 2023-02-22 DIAGNOSIS — H52223 Regular astigmatism, bilateral: Secondary | ICD-10-CM | POA: Diagnosis not present

## 2023-02-22 DIAGNOSIS — H11153 Pinguecula, bilateral: Secondary | ICD-10-CM | POA: Diagnosis not present

## 2023-04-07 ENCOUNTER — Other Ambulatory Visit: Payer: Self-pay | Admitting: Internal Medicine

## 2023-04-07 DIAGNOSIS — E785 Hyperlipidemia, unspecified: Secondary | ICD-10-CM

## 2023-04-17 ENCOUNTER — Ambulatory Visit (INDEPENDENT_AMBULATORY_CARE_PROVIDER_SITE_OTHER): Payer: Medicare Other | Admitting: Internal Medicine

## 2023-04-17 ENCOUNTER — Other Ambulatory Visit: Payer: Self-pay | Admitting: Internal Medicine

## 2023-04-17 ENCOUNTER — Encounter: Payer: Self-pay | Admitting: Internal Medicine

## 2023-04-17 VITALS — BP 136/78 | HR 92 | Temp 98.0°F | Ht 66.0 in | Wt 205.0 lb

## 2023-04-17 DIAGNOSIS — Z23 Encounter for immunization: Secondary | ICD-10-CM

## 2023-04-17 DIAGNOSIS — I10 Essential (primary) hypertension: Secondary | ICD-10-CM

## 2023-04-17 DIAGNOSIS — E785 Hyperlipidemia, unspecified: Secondary | ICD-10-CM | POA: Diagnosis not present

## 2023-04-17 DIAGNOSIS — R7303 Prediabetes: Secondary | ICD-10-CM

## 2023-04-17 DIAGNOSIS — J439 Emphysema, unspecified: Secondary | ICD-10-CM

## 2023-04-17 LAB — HEPATIC FUNCTION PANEL
ALT: 12 U/L (ref 0–53)
AST: 15 U/L (ref 0–37)
Albumin: 4.2 g/dL (ref 3.5–5.2)
Alkaline Phosphatase: 47 U/L (ref 39–117)
Bilirubin, Direct: 0.2 mg/dL (ref 0.0–0.3)
Total Bilirubin: 0.8 mg/dL (ref 0.2–1.2)
Total Protein: 6.9 g/dL (ref 6.0–8.3)

## 2023-04-17 LAB — CBC WITH DIFFERENTIAL/PLATELET
Basophils Absolute: 0 10*3/uL (ref 0.0–0.1)
Basophils Relative: 0.7 % (ref 0.0–3.0)
Eosinophils Absolute: 0.2 10*3/uL (ref 0.0–0.7)
Eosinophils Relative: 3.5 % (ref 0.0–5.0)
HCT: 46.7 % (ref 39.0–52.0)
Hemoglobin: 15.2 g/dL (ref 13.0–17.0)
Lymphocytes Relative: 23.1 % (ref 12.0–46.0)
Lymphs Abs: 1 10*3/uL (ref 0.7–4.0)
MCHC: 32.6 g/dL (ref 30.0–36.0)
MCV: 96.4 fl (ref 78.0–100.0)
Monocytes Absolute: 0.5 10*3/uL (ref 0.1–1.0)
Monocytes Relative: 10.9 % (ref 3.0–12.0)
Neutro Abs: 2.8 10*3/uL (ref 1.4–7.7)
Neutrophils Relative %: 61.8 % (ref 43.0–77.0)
Platelets: 227 10*3/uL (ref 150.0–400.0)
RBC: 4.85 Mil/uL (ref 4.22–5.81)
RDW: 13.4 % (ref 11.5–15.5)
WBC: 4.5 10*3/uL (ref 4.0–10.5)

## 2023-04-17 LAB — BASIC METABOLIC PANEL
BUN: 11 mg/dL (ref 6–23)
CO2: 25 mEq/L (ref 19–32)
Calcium: 9 mg/dL (ref 8.4–10.5)
Chloride: 104 mEq/L (ref 96–112)
Creatinine, Ser: 0.92 mg/dL (ref 0.40–1.50)
GFR: 78.71 mL/min (ref >60.00)
Glucose, Bld: 117 mg/dL — ABNORMAL HIGH (ref 70–99)
Potassium: 4.1 mEq/L (ref 3.5–5.1)
Sodium: 139 mEq/L (ref 135–145)

## 2023-04-17 LAB — LIPID PANEL
Cholesterol: 138 mg/dL (ref 0–200)
HDL: 62.4 mg/dL (ref >39.00)
LDL Cholesterol: 59 mg/dL (ref 0–99)
NonHDL: 75.3
Total CHOL/HDL Ratio: 2
Triglycerides: 81 mg/dL (ref 0.0–149.0)
VLDL: 16.2 mg/dL (ref 0.0–40.0)

## 2023-04-17 LAB — TSH: TSH: 1.33 u[IU]/mL (ref 0.35–5.50)

## 2023-04-17 LAB — HEMOGLOBIN A1C: Hgb A1c MFr Bld: 6.1 % (ref 4.6–6.5)

## 2023-04-17 MED ORDER — ROSUVASTATIN CALCIUM 20 MG PO TABS
10.0000 mg | ORAL_TABLET | Freq: Every day | ORAL | 1 refills | Status: DC
Start: 2023-04-17 — End: 2023-10-03

## 2023-04-17 NOTE — Progress Notes (Signed)
Subjective:  Patient ID: Connor Johnson, male    DOB: 07/20/1941  Age: 82 y.o. MRN: [id number]  CC: Hyperlipidemia   HPI Connor Johnson presents for f/up ----  Discussed the use of AI scribe software for clinical note transcription with the patient, who gave verbal consent to proceed.  History of Present Illness   The patient, with a history of hyperlipidemia and arthritis, reports no recent chest pain, shortness of breath, dizziness, or lightheadedness. He has been maintaining an active lifestyle, attending a fitness center three days a week for a couple of hours each day, with no exercise-induced symptoms. He has lost a few pounds recently, attributed to his exercise regimen. He reports no adverse effects from his cholesterol medication and differentiates his arthritis-related joint pain from muscle aches.  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 This has led to occasional irritability, but the patient has found some relief through advice from a fitness center counselor. The patient's spouse recently underwent a brain scan, the results of which are pending.       Outpatient Medications Prior to Visit  Medication Sig Dispense Refill   aspirin 81 MG EC tablet TAKE 1 TABLET(81 MG) BY MOUTH DAILY. SWALLOW WHOLE 90 tablet 0   diclofenac Sodium (VOLTAREN) 1 % GEL Apply 2 g topically 4 (four) times daily. 100 g 1   neomycin-polymyxin B (NEOSPORIN) SOLN neomycin-polymyxin-hydrocort 3.5 mg/mL-10,000 unit/mL-1 % ear solution  INSTILL 4 DROPS INTO THE AFFECTED EAR(S) TWICE DAILY FOR 5 DAYS     rosuvastatin (CRESTOR) 20 MG tablet TAKE 1/2 TABLET BY MOUTH EVERY DAY 45 tablet 1   No facility-administered medications prior to visit.    ROS Review of Systems  Constitutional: Negative.  Negative for diaphoresis  and fatigue.  HENT: Negative.    Eyes: Negative.   Respiratory:  Negative for cough, chest tightness, shortness of breath and wheezing.   Cardiovascular:  Negative for chest pain, palpitations and leg swelling.  Gastrointestinal:  Negative for abdominal pain, constipation, diarrhea and vomiting.  Endocrine: Negative.   Genitourinary: Negative.  Negative for difficulty urinating.  Musculoskeletal:  Positive for arthralgias. Negative for joint swelling and myalgias.  Skin: Negative.   Neurological: Negative.  Negative for dizziness and weakness.  Hematological:  Negative for adenopathy. Does not bruise/bleed easily.  Psychiatric/Behavioral: Negative.      Objective:  BP 136/78 (BP Location: Left Arm, Patient Position: Sitting, Cuff Size: Large)   Pulse 92   Temp 98 F (36.7 C) (Oral)   Ht 5\' 6"  (1.676 m)   Wt 205 lb (93 kg)   SpO2 94%   BMI 33.09 kg/m   BP Readings from Last 3 Encounters:  04/17/23 136/78  06/15/22 138/82  01/17/22 140/86    Wt Readings from Last 3 Encounters:  04/17/23 205 lb (93 kg)  06/15/22 198 lb (89.8 kg)  01/17/22 203 lb (92.1 kg)    Physical Exam Vitals reviewed.  Constitutional:      Appearance: Normal appearance.  HENT:     Mouth/Throat:     Mouth: Mucous membranes are moist.  Eyes:     General: No scleral icterus.    Conjunctiva/sclera: Conjunctivae normal.  Cardiovascular:     Rate and Rhythm: Normal rate and regular rhythm.     Heart sounds: No murmur heard. Pulmonary:  Effort: Pulmonary effort is normal.     Breath sounds: No stridor. No wheezing, rhonchi or rales.  Abdominal:     General: Abdomen is flat.     Palpations: There is no mass.     Tenderness: There is no abdominal tenderness. There is no guarding.     Hernia: No hernia is present.  Musculoskeletal:        General: Normal range of motion.     Cervical back: Neck supple.     Right lower leg: No edema.     Left lower leg: No edema.  Lymphadenopathy:      Cervical: No cervical adenopathy.  Skin:    General: Skin is warm and dry.  Neurological:     General: No focal deficit present.     Mental Status: He is alert. Mental status is at baseline.  Psychiatric:        Mood and Affect: Mood normal.        Behavior: Behavior normal.     Lab Results  Component Value Date   WBC 4.5 04/17/2023   HGB 15.2 04/17/2023   HCT 46.7 04/17/2023   PLT 227.0 04/17/2023   GLUCOSE 117 (H) 04/17/2023   CHOL 138 04/17/2023   TRIG 81.0 04/17/2023   HDL 62.40 04/17/2023   LDLCALC 59 04/17/2023   ALT 12 04/17/2023   AST 15 04/17/2023   NA 139 04/17/2023   K 4.1 04/17/2023   CL 104 04/17/2023   CREATININE 0.92 04/17/2023   BUN 11 04/17/2023   CO2 25 04/17/2023   TSH 1.33 04/17/2023   PSA 1.91 11/19/2019   HGBA1C 6.1 04/17/2023    MR BRAIN W WO CONTRAST  Result Date: 11/01/2018  [facility] [address], [address] [place], [place] [address] [phone] NEUROIMAGING REPORT STUDY DATE: 10/31/2018 PATIENT NAME: Chasen Ertl DOB: 1941-07-17 MRN: [id number] EXAM: MRI Brain with and without contrast ORDERING CLINICIAN: Naomie Dean, MD CLINICAL HISTORY: 82 year old man with encephalopathy, memory loss and confusion COMPARISON FILMS: MRI 06/10/2009 TECHNIQUE:MRI of the brain with and without contrast was obtained utilizing 5 mm axial slices with T1, T2, T2 flair, SWI and diffusion weighted views.  T1 sagittal, T2 coronal and postcontrast views in the axial and coronal plane were obtained. CONTRAST: 20 ml Multihance IMAGING SITE: [facility], [address] [place]. FINDINGS: On sagittal images, the spinal cord is imaged caudally to C3 and is normal in caliber.   The contents of the posterior fossa are of normal size and position.   The pituitary gland and optic chiasm appear normal.    There is mild to moderate generalized cortical atrophy that has progressed compared to the 2010 MRI.  There are no abnormal extra-axial collections of fluid.  There  are a couple small T2/flair hyperintense foci in the pons and one in the right cerebellar hemisphere.  The deep gray matter appears normal.  In the hemispheres there are single and confluent T2/flair hyperintense foci with the largest confluencies in the periatrial white matter.  The white matter changes have only slightly changed compared to the 2010 MRI.  Diffusion weighted images are normal.  Susceptibility weighted images are normal.   The orbits appear normal.   The VIIth/VIIIth nerve complex appears normal.  There is a left mastoid effusion.  The right mastoid air cells appear normal.  The paranasal sinuses appear normal.  Flow voids are identified within the major intracerebral arteries.  After the infusion of contrast material, a normal enhancement pattern is  noted.   This MRI of the brain with and without contrast shows the following: 1.    Mild to moderate generalized cortical atrophy progressed compared to the 2010 MRI. 2.    White matter changes most consistent with moderately severe chronic microvascular ischemic change with large confluencies in the periatrial white matter.  This has just slightly progressed compared to the 2010 MRI. 3.    Left mastoid effusion, likely due to eustachian tube dysfunction INTERPRETING PHYSICIAN: Richard A. Epimenio Foot, MD, PhD, FAAN Certified in  Neuroimaging by [employer]    Assessment & Plan:   Primary hypertension - His BP is well controlled. -     CBC with Differential/Platelet; Future -     Hepatic function panel; Future  Prediabetes -     Hemoglobin A1c; Future -     Basic metabolic panel; Future  Hyperlipidemia with target LDL less than 70 - LDL goal achieved. Doing well on the statin  -     Lipid panel; Future -     Hepatic function panel; Future -     TSH; Future  Pulmonary emphysema, unspecified emphysema type (HCC)- He does not want to use in inhaler. -     CBC with Differential/Platelet; Future  Other orders -      Pneumococcal polysaccharide vaccine 23-valent greater than or equal to 2yo subcutaneous/IM     Follow-up: Return in about 6 months (around 10/18/2023).  Sanda Linger, MD

## 2023-04-17 NOTE — Patient Instructions (Signed)

## 2023-04-25 DIAGNOSIS — H6643 Suppurative otitis media, unspecified, bilateral: Secondary | ICD-10-CM | POA: Diagnosis not present

## 2023-05-01 ENCOUNTER — Ambulatory Visit: Payer: Medicare Other | Admitting: Family Medicine

## 2023-05-16 DIAGNOSIS — H6641 Suppurative otitis media, unspecified, right ear: Secondary | ICD-10-CM | POA: Diagnosis not present

## 2023-06-20 DIAGNOSIS — H6641 Suppurative otitis media, unspecified, right ear: Secondary | ICD-10-CM | POA: Diagnosis not present

## 2023-07-18 DIAGNOSIS — Z23 Encounter for immunization: Secondary | ICD-10-CM | POA: Diagnosis not present

## 2023-07-26 DIAGNOSIS — H73893 Other specified disorders of tympanic membrane, bilateral: Secondary | ICD-10-CM | POA: Diagnosis not present

## 2023-07-26 DIAGNOSIS — H6123 Impacted cerumen, bilateral: Secondary | ICD-10-CM | POA: Insufficient documentation

## 2023-10-03 ENCOUNTER — Other Ambulatory Visit: Payer: Self-pay | Admitting: Internal Medicine

## 2023-10-03 DIAGNOSIS — E785 Hyperlipidemia, unspecified: Secondary | ICD-10-CM

## 2023-10-03 NOTE — Telephone Encounter (Signed)
Copied from CRM [id number]. Topic: Clinical - Medication Refill >> Oct 03, 2023  3:38 PM Almira Coaster wrote: Most Recent Primary Care Visit:  Provider: Etta Grandchild  Department: [facility]  Visit Type: OFFICE VISIT  Date: 04/17/2023  Medication: rosuvastatin (CRESTOR) 20 MG tablet  Has the patient contacted their pharmacy? No, Doctor must approve refill (Agent: If no, request that the patient contact the pharmacy for the refill. If patient does not wish to contact the pharmacy document the reason why and proceed with request.) (Agent: If yes, when and what did the pharmacy advise?)  Is this the correct pharmacy for this prescription? Yes If no, delete pharmacy and type the correct one.  This is the patient's preferred pharmacy:  [id number] - [place], [place] - [address] AT [facility] [address] [place] [id number] Phone: [phone] Fax: [phone]   Has the prescription been filled recently? No  Is the patient out of the medication? No, has 10 pills left.   Has the patient been seen for an appointment in the last year OR does the patient have an upcoming appointment? Yes, patient is scheduled for March 5th, won't have enough medication to hold him over.   Can we respond through MyChart? No  Agent: Please be advised that Rx refills may take up to 3 business days. We ask that you follow-up with your pharmacy.

## 2023-10-04 MED ORDER — ROSUVASTATIN CALCIUM 20 MG PO TABS: 10.0000 mg | ORAL_TABLET | Freq: Every day | ORAL | 1 refills | Status: DC

## 2023-11-14 ENCOUNTER — Ambulatory Visit: Payer: Medicare Other | Admitting: Internal Medicine

## 2023-11-14 ENCOUNTER — Encounter: Payer: Self-pay | Admitting: Internal Medicine

## 2023-11-14 VITALS — BP 152/88 | HR 89 | Temp 98.2°F | Resp 16 | Ht 66.0 in | Wt 200.2 lb

## 2023-11-14 DIAGNOSIS — R9431 Abnormal electrocardiogram [ECG] [EKG]: Secondary | ICD-10-CM

## 2023-11-14 DIAGNOSIS — I10 Essential (primary) hypertension: Secondary | ICD-10-CM | POA: Diagnosis not present

## 2023-11-14 DIAGNOSIS — R7303 Prediabetes: Secondary | ICD-10-CM | POA: Diagnosis not present

## 2023-11-14 DIAGNOSIS — R600 Localized edema: Secondary | ICD-10-CM | POA: Diagnosis not present

## 2023-11-14 LAB — CBC WITH DIFFERENTIAL/PLATELET
Basophils Absolute: 0 10*3/uL (ref 0.0–0.1)
Basophils Relative: 0.6 % (ref 0.0–3.0)
Eosinophils Absolute: 0.1 10*3/uL (ref 0.0–0.7)
Eosinophils Relative: 1.7 % (ref 0.0–5.0)
HCT: 47.5 % (ref 39.0–52.0)
Hemoglobin: 15.8 g/dL (ref 13.0–17.0)
Lymphocytes Relative: 20.1 % (ref 12.0–46.0)
Lymphs Abs: 1.2 10*3/uL (ref 0.7–4.0)
MCHC: 33.2 g/dL (ref 30.0–36.0)
MCV: 95.8 fl (ref 78.0–100.0)
Monocytes Absolute: 0.7 10*3/uL (ref 0.1–1.0)
Monocytes Relative: 11.3 % (ref 3.0–12.0)
Neutro Abs: 3.9 10*3/uL (ref 1.4–7.7)
Neutrophils Relative %: 66.3 % (ref 43.0–77.0)
Platelets: 260 10*3/uL (ref 150.0–400.0)
RBC: 4.95 Mil/uL (ref 4.22–5.81)
RDW: 13.1 % (ref 11.5–15.5)
WBC: 5.9 10*3/uL (ref 4.0–10.5)

## 2023-11-14 LAB — BASIC METABOLIC PANEL
BUN: 14 mg/dL (ref 6–23)
CO2: 28 meq/L (ref 19–32)
Calcium: 9.2 mg/dL (ref 8.4–10.5)
Chloride: 103 meq/L (ref 96–112)
Creatinine, Ser: 0.85 mg/dL (ref 0.40–1.50)
GFR: 81.89 mL/min (ref >60.00)
Glucose, Bld: 112 mg/dL — ABNORMAL HIGH (ref 70–99)
Potassium: 4.2 meq/L (ref 3.5–5.1)
Sodium: 138 meq/L (ref 135–145)

## 2023-11-14 LAB — URINALYSIS, ROUTINE W REFLEX MICROSCOPIC
Bilirubin Urine: NEGATIVE
Hgb urine dipstick: NEGATIVE
Ketones, ur: NEGATIVE
Leukocytes,Ua: NEGATIVE
Nitrite: NEGATIVE
RBC / HPF: NONE SEEN (ref 0–?)
Specific Gravity, Urine: 1.01 (ref 1.000–1.030)
Total Protein, Urine: NEGATIVE
Urine Glucose: NEGATIVE
Urobilinogen, UA: 1 (ref 0.0–1.0)
pH: 6.5 (ref 5.0–8.0)

## 2023-11-14 LAB — D-DIMER, QUANTITATIVE: D-Dimer, Quant: 0.84 ug{FEU}/mL — ABNORMAL HIGH (ref <0.50)

## 2023-11-14 LAB — BRAIN NATRIURETIC PEPTIDE: Pro B Natriuretic peptide (BNP): 23 pg/mL (ref 0.0–100.0)

## 2023-11-14 LAB — HEMOGLOBIN A1C: Hgb A1c MFr Bld: 6.1 % (ref 4.6–6.5)

## 2023-11-14 LAB — TROPONIN I (HIGH SENSITIVITY): High Sens Troponin I: 7 ng/L (ref 2–17)

## 2023-11-14 MED ORDER — TORSEMIDE 20 MG PO TABS: 20.0000 mg | ORAL_TABLET | Freq: Every day | ORAL | 0 refills | Status: DC

## 2023-11-14 NOTE — Progress Notes (Signed)
 Subjective:  Patient ID: Connor Johnson, male    DOB: [birth year]  Age: 83 y.o. MRN: [id number]  CC: Hypertension   HPI Connor Johnson presents for f/up -----  Discussed the use of AI scribe software for clinical note transcription with the patient, who gave verbal consent to proceed.  History of Present Illness   Connor Johnson is an 83 year old male with hypertension who presents with new fingertip symptoms.  He describes a new sensation in his fingertips, likening it to 'really thick calluses' without any actual calluses present. There is no associated pain. He recalls a past injury where the end of one finger was ripped off, resulting in scar tissue and some numbness, though he is uncertain if this is related to his current symptoms.  He monitors his blood pressure daily, with a recent home reading of 116/76 mmHg. However, during the visit, his blood pressure was recorded at 152/100 mmHg, indicating variability in his readings. He occasionally notices slight swelling in his legs, which he attributes to the tightness of his socks.  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 He feels good during physical activities without experiencing chest pain, shortness of breath, dizziness, or lightheadedness.  He reports regular bowel movements, stating 'you can almost set the clock by me.' No chest pain, shortness of breath, dizziness, lightheadedness, or pain or swelling in the upper body. Occasional slight swelling in the legs.       Outpatient Medications Prior to Visit  Medication Sig Dispense Refill   aspirin 81 MG EC tablet TAKE 1 TABLET(81 MG) BY MOUTH DAILY. SWALLOW WHOLE 90 tablet 0   rosuvastatin (CRESTOR) 20 MG tablet Take 0.5 tablets (10 mg total) by mouth daily. 45 tablet 1   diclofenac Sodium (VOLTAREN) 1 % GEL Apply 2 g topically 4 (four) times daily. 100 g 1   neomycin-polymyxin B (NEOSPORIN) SOLN  neomycin-polymyxin-hydrocort 3.5 mg/mL-10,000 unit/mL-1 % ear solution  INSTILL 4 DROPS INTO THE AFFECTED EAR(S) TWICE DAILY FOR 5 DAYS     No facility-administered medications prior to visit.    ROS Review of Systems  Constitutional: Negative.  Negative for diaphoresis and fatigue.  HENT: Negative.  Negative for trouble swallowing.   Eyes: Negative.   Respiratory: Negative.  Negative for cough, chest tightness, shortness of breath and wheezing.   Cardiovascular:  Positive for leg swelling. Negative for chest pain and palpitations.  Gastrointestinal: Negative.  Negative for abdominal pain, constipation, diarrhea, nausea and vomiting.  Genitourinary: Negative.  Negative for difficulty urinating.  Musculoskeletal: Negative.   Skin: Negative.   Neurological:  Negative for dizziness and weakness.  Hematological:  Negative for adenopathy. Does not bruise/bleed easily.  Psychiatric/Behavioral:  Positive for confusion and decreased concentration. Negative for suicidal ideas. The patient is not nervous/anxious.     Objective:  BP (!) 152/88 (BP Location: Left Arm, Patient Position: Sitting, Cuff Size: Normal)   Pulse 89   Temp 98.2 F (36.8 C) (Oral)   Resp 16   Ht 5\' 6"  (1.676 m)   Wt 200 lb 3.2 oz (90.8 kg)   SpO2 96%   BMI 32.31 kg/m   BP Readings from Last 3 Encounters:  11/14/23 (!) 152/88  04/17/23 136/78  06/15/22 138/82    Wt Readings from Last 3 Encounters:  11/14/23 200 lb 3.2 oz (90.8 kg)  04/17/23 205 lb (93 kg)  06/15/22 198 lb (89.8 kg)    Physical Exam Vitals reviewed.  Constitutional:  Appearance: Normal appearance.  HENT:     Mouth/Throat:     Mouth: Mucous membranes are moist.  Eyes:     General: No scleral icterus.    Conjunctiva/sclera: Conjunctivae normal.  Cardiovascular:     Rate and Rhythm: Normal rate and regular rhythm.     Heart sounds: No murmur heard.    No friction rub. No gallop.     Comments: EKG---  NSR, 92 bpm NS ST  abnormality Q in III No LVH Unchanged  Pulmonary:     Effort: Pulmonary effort is normal.     Breath sounds: No stridor. No wheezing, rhonchi or rales.  Abdominal:     General: Abdomen is flat.     Palpations: There is no mass.     Tenderness: There is no abdominal tenderness. There is no guarding.     Hernia: No hernia is present.  Musculoskeletal:        General: No swelling.     Cervical back: Neck supple.     Right lower leg: Edema (trace pitting) present.     Left lower leg: Edema (trace pitting) present.  Lymphadenopathy:     Cervical: No cervical adenopathy.  Skin:    General: Skin is warm and dry.  Neurological:     General: No focal deficit present.     Mental Status: He is alert. Mental status is at baseline.  Psychiatric:        Mood and Affect: Mood normal.        Behavior: Behavior normal.     Lab Results  Component Value Date   WBC 5.9 11/14/2023   HGB 15.8 11/14/2023   HCT 47.5 11/14/2023   PLT 260.0 11/14/2023   GLUCOSE 112 (H) 11/14/2023   CHOL 138 04/17/2023   TRIG 81.0 04/17/2023   HDL 62.40 04/17/2023   LDLCALC 59 04/17/2023   ALT 12 04/17/2023   AST 15 04/17/2023   NA 138 11/14/2023   K 4.2 11/14/2023   CL 103 11/14/2023   CREATININE 0.85 11/14/2023   BUN 14 11/14/2023   CO2 28 11/14/2023   TSH 1.33 04/17/2023   PSA 1.91 11/19/2019   HGBA1C 6.1 11/14/2023    MR BRAIN W WO CONTRAST Result Date: 11/01/2018  [facility] [address] [place], [place] [address] [phone] NEUROIMAGING REPORT STUDY DATE: 10/31/2018 PATIENT NAME: Connor Johnson DOB: 05-15-1941 MRN: [id number] EXAM: MRI Brain with and without contrast ORDERING CLINICIAN: Naomie Dean, MD CLINICAL HISTORY: 83 year old man with encephalopathy, memory loss and confusion COMPARISON FILMS: MRI 06/10/2009 TECHNIQUE:MRI of the brain with and without contrast was obtained utilizing 5 mm axial slices with T1, T2, T2 flair, SWI and diffusion weighted views.  T1  sagittal, T2 coronal and postcontrast views in the axial and coronal plane were obtained. CONTRAST: 20 ml Multihance IMAGING SITE: [employer], [address] [place]. FINDINGS: On sagittal images, the spinal cord is imaged caudally to C3 and is normal in caliber.   The contents of the posterior fossa are of normal size and position.   The pituitary gland and optic chiasm appear normal.    There is mild to moderate generalized cortical atrophy that has progressed compared to the 2010 MRI.  There are no abnormal extra-axial collections of fluid.  There are a couple small T2/flair hyperintense foci in the pons and one in the right cerebellar hemisphere.  The deep gray matter appears normal.  In the hemispheres there are single and confluent T2/flair hyperintense foci  with the largest confluencies in the periatrial white matter.  The white matter changes have only slightly changed compared to the 2010 MRI.  Diffusion weighted images are normal.  Susceptibility weighted images are normal.   The orbits appear normal.   The VIIth/VIIIth nerve complex appears normal.  There is a left mastoid effusion.  The right mastoid air cells appear normal.  The paranasal sinuses appear normal.  Flow voids are identified within the major intracerebral arteries.  After the infusion of contrast material, a normal enhancement pattern is noted.   This MRI of the brain with and without contrast shows the following: 1.    Mild to moderate generalized cortical atrophy progressed compared to the 2010 MRI. 2.    White matter changes most consistent with moderately severe chronic microvascular ischemic change with large confluencies in the periatrial white matter.  This has just slightly progressed compared to the 2010 MRI. 3.    Left mastoid effusion, likely due to eustachian tube dysfunction INTERPRETING PHYSICIAN: Richard A. Epimenio Foot, MD, PhD, FAAN Certified in  Neuroimaging by [employer]    Assessment & Plan:    Primary hypertension- Will start indapamide. -     EKG 12-Lead -     Basic metabolic panel; Future -     CBC with Differential/Platelet; Future -     Urinalysis, Routine w reflex microscopic; Future -     Torsemide; Take 1 tablet (20 mg total) by mouth daily.  Dispense: 90 tablet; Refill: 0  Prediabetes -     Hemoglobin A1c; Future  Bilateral leg edema- Labs and EKG are reassuring.  Will start indapamide. -     Troponin I (High Sensitivity); Future -     Brain natriuretic peptide; Future -     D-dimer, quantitative; Future -     Urinalysis, Routine w reflex microscopic; Future -     Torsemide; Take 1 tablet (20 mg total) by mouth daily.  Dispense: 90 tablet; Refill: 0  Abnormal electrocardiogram (ECG) (EKG) -     Troponin I (High Sensitivity); Future -     Brain natriuretic peptide; Future -     D-dimer, quantitative; Future     Follow-up: Return in about 6 months (around 05/16/2024).  Sanda Linger, MD

## 2023-11-14 NOTE — Patient Instructions (Signed)
 Hypertension, Adult High blood pressure (hypertension) is when the force of blood pumping through the arteries is too strong. The arteries are the blood vessels that carry blood from the heart throughout the body. Hypertension forces the heart to work harder to pump blood and may cause arteries to become narrow or stiff. Untreated or uncontrolled hypertension can lead to a heart attack, heart failure, a stroke, kidney disease, and other problems. A blood pressure reading consists of a higher number over a lower number. Ideally, your blood pressure should be below 120/80. The first ("top") number is called the systolic pressure. It is a measure of the pressure in your arteries as your heart beats. The second ("bottom") number is called the diastolic pressure. It is a measure of the pressure in your arteries as the heart relaxes. What are the causes? The exact cause of this condition is not known. There are some conditions that result in high blood pressure. What increases the risk? Certain factors may make you more likely to develop high blood pressure. Some of these risk factors are under your control, including: Smoking. Not getting enough exercise or physical activity. Being overweight. Having too much fat, sugar, calories, or salt (sodium) in your diet. Drinking too much alcohol. Other risk factors include: Having a personal history of heart disease, diabetes, high cholesterol, or kidney disease. Stress. Having a family history of high blood pressure and high cholesterol. Having obstructive sleep apnea. Age. The risk increases with age. What are the signs or symptoms? High blood pressure may not cause symptoms. Very high blood pressure (hypertensive crisis) may cause: Headache. Fast or irregular heartbeats (palpitations). Shortness of breath. Nosebleed. Nausea and vomiting. Vision changes. Severe chest pain, dizziness, and seizures. How is this diagnosed? This condition is diagnosed by  measuring your blood pressure while you are seated, with your arm resting on a flat surface, your legs uncrossed, and your feet flat on the floor. The cuff of the blood pressure monitor will be placed directly against the skin of your upper arm at the level of your heart. Blood pressure should be measured at least twice using the same arm. Certain conditions can cause a difference in blood pressure between your right and left arms. If you have a high blood pressure reading during one visit or you have normal blood pressure with other risk factors, you may be asked to: Return on a different day to have your blood pressure checked again. Monitor your blood pressure at home for 1 week or longer. If you are diagnosed with hypertension, you may have other blood or imaging tests to help your health care provider understand your overall risk for other conditions. How is this treated? This condition is treated by making healthy lifestyle changes, such as eating healthy foods, exercising more, and reducing your alcohol intake. You may be referred for counseling on a healthy diet and physical activity. Your health care provider may prescribe medicine if lifestyle changes are not enough to get your blood pressure under control and if: Your systolic blood pressure is above 130. Your diastolic blood pressure is above 80. Your personal target blood pressure may vary depending on your medical conditions, your age, and other factors. Follow these instructions at home: Eating and drinking  Eat a diet that is high in fiber and potassium, and low in sodium, added sugar, and fat. An example of this eating plan is called the DASH diet. DASH stands for Dietary Approaches to Stop Hypertension. To eat this way: Eat  plenty of fresh fruits and vegetables. Try to fill one half of your plate at each meal with fruits and vegetables. Eat whole grains, such as whole-wheat pasta, brown rice, or whole-grain bread. Fill about one  fourth of your plate with whole grains. Eat or drink low-fat dairy products, such as skim milk or low-fat yogurt. Avoid fatty cuts of meat, processed or cured meats, and poultry with skin. Fill about one fourth of your plate with lean proteins, such as fish, chicken without skin, beans, eggs, or tofu. Avoid pre-made and processed foods. These tend to be higher in sodium, added sugar, and fat. Reduce your daily sodium intake. Many people with hypertension should eat less than 1,500 mg of sodium a day. Do not drink alcohol if: Your health care provider tells you not to drink. You are pregnant, may be pregnant, or are planning to become pregnant. If you drink alcohol: Limit how much you have to: 0-1 drink a day for women. 0-2 drinks a day for men. Know how much alcohol is in your drink. In the [place] one drink equals one 12 oz bottle of beer (355 mL), one 5 oz glass of wine (148 mL), or one 1 oz glass of hard liquor (44 mL). Lifestyle  Work with your health care provider to maintain a healthy body weight or to lose weight. Ask what an ideal weight is for you. Get at least 30 minutes of exercise that causes your heart to beat faster (aerobic exercise) most days of the week. Activities may include walking, swimming, or biking. Include exercise to strengthen your muscles (resistance exercise), such as Pilates or lifting weights, as part of your weekly exercise routine. Try to do these types of exercises for 30 minutes at least 3 days a week. Do not use any products that contain nicotine or tobacco. These products include cigarettes, chewing tobacco, and vaping devices, such as e-cigarettes. If you need help quitting, ask your health care provider. Monitor your blood pressure at home as told by your health care provider. Keep all follow-up visits. This is important. Medicines Take over-the-counter and prescription medicines only as told by your health care provider. Follow directions carefully. Blood  pressure medicines must be taken as prescribed. Do not skip doses of blood pressure medicine. Doing this puts you at risk for problems and can make the medicine less effective. Ask your health care provider about side effects or reactions to medicines that you should watch for. Contact a health care provider if you: Think you are having a reaction to a medicine you are taking. Have headaches that keep coming back (recurring). Feel dizzy. Have swelling in your ankles. Have trouble with your vision. Get help right away if you: Develop a severe headache or confusion. Have unusual weakness or numbness. Feel faint. Have severe pain in your chest or abdomen. Vomit repeatedly. Have trouble breathing. These symptoms may be an emergency. Get help right away. Call 911. Do not wait to see if the symptoms will go away. Do not drive yourself to the hospital. Summary Hypertension is when the force of blood pumping through your arteries is too strong. If this condition is not controlled, it may put you at risk for serious complications. Your personal target blood pressure may vary depending on your medical conditions, your age, and other factors. For most people, a normal blood pressure is less than 120/80. Hypertension is treated with lifestyle changes, medicines, or a combination of both. Lifestyle changes include losing weight, eating a healthy,  low-sodium diet, exercising more, and limiting alcohol. This information is not intended to replace advice given to you by your health care provider. Make sure you discuss any questions you have with your health care provider. Document Revised: 07/05/2021 Document Reviewed: 07/05/2021 Elsevier Patient Education  2024 [employer].

## 2023-11-16 ENCOUNTER — Encounter: Payer: Self-pay | Admitting: Internal Medicine

## 2024-04-21 ENCOUNTER — Other Ambulatory Visit: Payer: Self-pay | Admitting: Internal Medicine

## 2024-04-21 DIAGNOSIS — E785 Hyperlipidemia, unspecified: Secondary | ICD-10-CM

## 2024-05-01 ENCOUNTER — Encounter: Payer: Self-pay | Admitting: Internal Medicine

## 2024-05-01 ENCOUNTER — Ambulatory Visit (INDEPENDENT_AMBULATORY_CARE_PROVIDER_SITE_OTHER): Admitting: Internal Medicine

## 2024-05-01 ENCOUNTER — Ambulatory Visit: Payer: Self-pay | Admitting: Internal Medicine

## 2024-05-01 VITALS — BP 136/74 | HR 86 | Temp 98.1°F | Ht 66.0 in | Wt 203.6 lb

## 2024-05-01 DIAGNOSIS — J439 Emphysema, unspecified: Secondary | ICD-10-CM | POA: Diagnosis not present

## 2024-05-01 DIAGNOSIS — E11618 Type 2 diabetes mellitus with other diabetic arthropathy: Secondary | ICD-10-CM | POA: Diagnosis not present

## 2024-05-01 DIAGNOSIS — I10 Essential (primary) hypertension: Secondary | ICD-10-CM | POA: Diagnosis not present

## 2024-05-01 DIAGNOSIS — E785 Hyperlipidemia, unspecified: Secondary | ICD-10-CM

## 2024-05-01 DIAGNOSIS — E1142 Type 2 diabetes mellitus with diabetic polyneuropathy: Secondary | ICD-10-CM | POA: Insufficient documentation

## 2024-05-01 DIAGNOSIS — R7303 Prediabetes: Secondary | ICD-10-CM

## 2024-05-01 LAB — CBC WITH DIFFERENTIAL/PLATELET
Basophils Absolute: 0 K/uL (ref 0.0–0.1)
Basophils Relative: 0.6 % (ref 0.0–3.0)
Eosinophils Absolute: 0.2 K/uL (ref 0.0–0.7)
Eosinophils Relative: 3.5 % (ref 0.0–5.0)
HCT: 46 % (ref 39.0–52.0)
Hemoglobin: 15.2 g/dL (ref 13.0–17.0)
Lymphocytes Relative: 21.3 % (ref 12.0–46.0)
Lymphs Abs: 1.4 K/uL (ref 0.7–4.0)
MCHC: 33 g/dL (ref 30.0–36.0)
MCV: 96.2 fl (ref 78.0–100.0)
Monocytes Absolute: 0.7 K/uL (ref 0.1–1.0)
Monocytes Relative: 11.6 % (ref 3.0–12.0)
Neutro Abs: 4 K/uL (ref 1.4–7.7)
Neutrophils Relative %: 63 % (ref 43.0–77.0)
Platelets: 260 K/uL (ref 150.0–400.0)
RBC: 4.78 Mil/uL (ref 4.22–5.81)
RDW: 13 % (ref 11.5–15.5)
WBC: 6.4 K/uL (ref 4.0–10.5)

## 2024-05-01 LAB — HEPATIC FUNCTION PANEL
ALT: 14 U/L (ref 0–53)
AST: 17 U/L (ref 0–37)
Albumin: 4.2 g/dL (ref 3.5–5.2)
Alkaline Phosphatase: 49 U/L (ref 39–117)
Bilirubin, Direct: 0.2 mg/dL (ref 0.0–0.3)
Total Bilirubin: 0.8 mg/dL (ref 0.2–1.2)
Total Protein: 6.9 g/dL (ref 6.0–8.3)

## 2024-05-01 LAB — LIPID PANEL
Cholesterol: 133 mg/dL (ref 0–200)
HDL: 61.8 mg/dL (ref >39.00)
LDL Cholesterol: 54 mg/dL (ref 0–99)
NonHDL: 71.64
Total CHOL/HDL Ratio: 2
Triglycerides: 88 mg/dL (ref 0.0–149.0)
VLDL: 17.6 mg/dL (ref 0.0–40.0)

## 2024-05-01 LAB — BASIC METABOLIC PANEL WITH GFR
BUN: 13 mg/dL (ref 6–23)
CO2: 28 meq/L (ref 19–32)
Calcium: 8.8 mg/dL (ref 8.4–10.5)
Chloride: 104 meq/L (ref 96–112)
Creatinine, Ser: 0.88 mg/dL (ref 0.40–1.50)
GFR: 80.77 mL/min (ref >60.00)
Glucose, Bld: 113 mg/dL — ABNORMAL HIGH (ref 70–99)
Potassium: 4.5 meq/L (ref 3.5–5.1)
Sodium: 139 meq/L (ref 135–145)

## 2024-05-01 LAB — HEMOGLOBIN A1C: Hgb A1c MFr Bld: 6.5 % (ref 4.6–6.5)

## 2024-05-01 LAB — TSH: TSH: 1.54 u[IU]/mL (ref 0.35–5.50)

## 2024-05-01 MED ORDER — ROSUVASTATIN CALCIUM 20 MG PO TABS: 20.0000 mg | ORAL_TABLET | Freq: Every day | ORAL | 1 refills | Status: AC

## 2024-05-01 NOTE — Patient Instructions (Signed)
 Hypertension, Adult High blood pressure (hypertension) is when the force of blood pumping through the arteries is too strong. The arteries are the blood vessels that carry blood from the heart throughout the body. Hypertension forces the heart to work harder to pump blood and may cause arteries to become narrow or stiff. Untreated or uncontrolled hypertension can lead to a heart attack, heart failure, a stroke, kidney disease, and other problems. A blood pressure reading consists of a higher number over a lower number. Ideally, your blood pressure should be below 120/80. The first ("top") number is called the systolic pressure. It is a measure of the pressure in your arteries as your heart beats. The second ("bottom") number is called the diastolic pressure. It is a measure of the pressure in your arteries as the heart relaxes. What are the causes? The exact cause of this condition is not known. There are some conditions that result in high blood pressure. What increases the risk? Certain factors may make you more likely to develop high blood pressure. Some of these risk factors are under your control, including: Smoking. Not getting enough exercise or physical activity. Being overweight. Having too much fat, sugar, calories, or salt (sodium) in your diet. Drinking too much alcohol. Other risk factors include: Having a personal history of heart disease, diabetes, high cholesterol, or kidney disease. Stress. Having a family history of high blood pressure and high cholesterol. Having obstructive sleep apnea. Age. The risk increases with age. What are the signs or symptoms? High blood pressure may not cause symptoms. Very high blood pressure (hypertensive crisis) may cause: Headache. Fast or irregular heartbeats (palpitations). Shortness of breath. Nosebleed. Nausea and vomiting. Vision changes. Severe chest pain, dizziness, and seizures. How is this diagnosed? This condition is diagnosed by  measuring your blood pressure while you are seated, with your arm resting on a flat surface, your legs uncrossed, and your feet flat on the floor. The cuff of the blood pressure monitor will be placed directly against the skin of your upper arm at the level of your heart. Blood pressure should be measured at least twice using the same arm. Certain conditions can cause a difference in blood pressure between your right and left arms. If you have a high blood pressure reading during one visit or you have normal blood pressure with other risk factors, you may be asked to: Return on a different day to have your blood pressure checked again. Monitor your blood pressure at home for 1 week or longer. If you are diagnosed with hypertension, you may have other blood or imaging tests to help your health care provider understand your overall risk for other conditions. How is this treated? This condition is treated by making healthy lifestyle changes, such as eating healthy foods, exercising more, and reducing your alcohol intake. You may be referred for counseling on a healthy diet and physical activity. Your health care provider may prescribe medicine if lifestyle changes are not enough to get your blood pressure under control and if: Your systolic blood pressure is above 130. Your diastolic blood pressure is above 80. Your personal target blood pressure may vary depending on your medical conditions, your age, and other factors. Follow these instructions at home: Eating and drinking  Eat a diet that is high in fiber and potassium, and low in sodium, added sugar, and fat. An example of this eating plan is called the DASH diet. DASH stands for Dietary Approaches to Stop Hypertension. To eat this way: Eat  plenty of fresh fruits and vegetables. Try to fill one half of your plate at each meal with fruits and vegetables. Eat whole grains, such as whole-wheat pasta, brown rice, or whole-grain bread. Fill about one  fourth of your plate with whole grains. Eat or drink low-fat dairy products, such as skim milk or low-fat yogurt. Avoid fatty cuts of meat, processed or cured meats, and poultry with skin. Fill about one fourth of your plate with lean proteins, such as fish, chicken without skin, beans, eggs, or tofu. Avoid pre-made and processed foods. These tend to be higher in sodium, added sugar, and fat. Reduce your daily sodium intake. Many people with hypertension should eat less than 1,500 mg of sodium a day. Do not drink alcohol if: Your health care provider tells you not to drink. You are pregnant, may be pregnant, or are planning to become pregnant. If you drink alcohol: Limit how much you have to: 0-1 drink a day for women. 0-2 drinks a day for men. Know how much alcohol is in your drink. In the [place] one drink equals one 12 oz bottle of beer (355 mL), one 5 oz glass of wine (148 mL), or one 1 oz glass of hard liquor (44 mL). Lifestyle  Work with your health care provider to maintain a healthy body weight or to lose weight. Ask what an ideal weight is for you. Get at least 30 minutes of exercise that causes your heart to beat faster (aerobic exercise) most days of the week. Activities may include walking, swimming, or biking. Include exercise to strengthen your muscles (resistance exercise), such as Pilates or lifting weights, as part of your weekly exercise routine. Try to do these types of exercises for 30 minutes at least 3 days a week. Do not use any products that contain nicotine or tobacco. These products include cigarettes, chewing tobacco, and vaping devices, such as e-cigarettes. If you need help quitting, ask your health care provider. Monitor your blood pressure at home as told by your health care provider. Keep all follow-up visits. This is important. Medicines Take over-the-counter and prescription medicines only as told by your health care provider. Follow directions carefully. Blood  pressure medicines must be taken as prescribed. Do not skip doses of blood pressure medicine. Doing this puts you at risk for problems and can make the medicine less effective. Ask your health care provider about side effects or reactions to medicines that you should watch for. Contact a health care provider if you: Think you are having a reaction to a medicine you are taking. Have headaches that keep coming back (recurring). Feel dizzy. Have swelling in your ankles. Have trouble with your vision. Get help right away if you: Develop a severe headache or confusion. Have unusual weakness or numbness. Feel faint. Have severe pain in your chest or abdomen. Vomit repeatedly. Have trouble breathing. These symptoms may be an emergency. Get help right away. Call 911. Do not wait to see if the symptoms will go away. Do not drive yourself to the hospital. Summary Hypertension is when the force of blood pumping through your arteries is too strong. If this condition is not controlled, it may put you at risk for serious complications. Your personal target blood pressure may vary depending on your medical conditions, your age, and other factors. For most people, a normal blood pressure is less than 120/80. Hypertension is treated with lifestyle changes, medicines, or a combination of both. Lifestyle changes include losing weight, eating a healthy,  low-sodium diet, exercising more, and limiting alcohol. This information is not intended to replace advice given to you by your health care provider. Make sure you discuss any questions you have with your health care provider. Document Revised: 07/05/2021 Document Reviewed: 07/05/2021 Elsevier Patient Education  2024 [employer].

## 2024-05-01 NOTE — Progress Notes (Unsigned)
 Subjective:  Patient ID: Connor Johnson, male    DOB: 01-30-1941  Age: 83 y.o. MRN: [id number]  CC: Hypertension and Hyperlipidemia   HPI Tao Satz presents for f/up ---  Discussed the use of AI scribe software for clinical note transcription with the patient, who gave verbal consent to proceed.  History of Present Illness Connor Johnson is an 83 year old male who presents for a routine follow-up visit.  He feels very good overall, with occasional slight swelling in the ankles, particularly after heavy physical activity such as cutting grass. The swelling tends to occur at night and is not associated with pain. No chest pain, shortness of breath, dizziness, lightheadedness, or irregular heartbeats during physical activities.  He experiences chronic joint aches but no muscle aches. He is currently taking Crestor  and has not noticed any muscle-related side effects from the medication.  He continues to engage in physical activities, including going to the gym for cardio and weight training a couple of times a week, although he notes a decrease in the amount of weight he lifts due to aging. He is actively involved in yard work and has taken on additional responsibilities at home as a caregiver for his wife, who has a deteriorating condition.     Outpatient Medications Prior to Visit  Medication Sig Dispense Refill   aspirin  81 MG EC tablet TAKE 1 TABLET(81 MG) BY MOUTH DAILY. SWALLOW WHOLE 90 tablet 0   rosuvastatin  (CRESTOR ) 20 MG tablet TAKE 1/2 TABLET(10 MG) BY MOUTH DAILY 45 tablet 1   torsemide  (DEMADEX ) 20 MG tablet Take 1 tablet (20 mg total) by mouth daily. 90 tablet 0   No facility-administered medications prior to visit.    ROS Review of Systems  Constitutional:  Negative for appetite change, chills, diaphoresis, fatigue and fever.  HENT: Negative.    Eyes: Negative.   Respiratory: Negative.  Negative for cough, chest tightness and wheezing.   Cardiovascular:  Negative for chest  pain, palpitations and leg swelling.  Gastrointestinal: Negative.  Negative for abdominal pain, constipation, diarrhea, nausea and vomiting.  Endocrine: Negative.   Genitourinary: Negative.  Negative for difficulty urinating.  Musculoskeletal:  Positive for arthralgias. Negative for myalgias.  Skin: Negative.  Negative for color change.  Neurological:  Negative for dizziness and weakness.  Hematological:  Negative for adenopathy. Does not bruise/bleed easily.  Psychiatric/Behavioral:  Positive for decreased concentration. Negative for confusion. The patient is not nervous/anxious and is not hyperactive.     Objective:  BP 136/74 (BP Location: Left Arm, Patient Position: Sitting, Cuff Size: Normal)   Pulse 86   Temp 98.1 F (36.7 C) (Oral)   Ht 5' 6 (1.676 m)   Wt 203 lb 9.6 oz (92.4 kg)   SpO2 94%   BMI 32.86 kg/m   BP Readings from Last 3 Encounters:  05/01/24 136/74  11/14/23 (!) 152/88  04/17/23 136/78    Wt Readings from Last 3 Encounters:  05/01/24 203 lb 9.6 oz (92.4 kg)  11/14/23 200 lb 3.2 oz (90.8 kg)  04/17/23 205 lb (93 kg)    Physical Exam Vitals reviewed.  Constitutional:      Appearance: Normal appearance.  HENT:     Nose: Nose normal.     Mouth/Throat:     Mouth: Mucous membranes are moist.  Eyes:     General: No scleral icterus.    Conjunctiva/sclera: Conjunctivae normal.  Cardiovascular:     Rate and Rhythm: Normal rate and regular rhythm.  Heart sounds: No murmur heard.    No friction rub. No gallop.  Pulmonary:     Effort: Pulmonary effort is normal.     Breath sounds: No stridor. No wheezing, rhonchi or rales.  Abdominal:     General: Abdomen is flat.     Palpations: There is no mass.     Tenderness: There is no abdominal tenderness. There is no guarding.     Hernia: No hernia is present.  Musculoskeletal:        General: Normal range of motion.     Cervical back: Neck supple.     Right lower leg: No edema.     Left lower leg: No  edema.  Lymphadenopathy:     Cervical: No cervical adenopathy.  Skin:    General: Skin is warm and dry.  Neurological:     General: No focal deficit present.     Mental Status: He is alert. Mental status is at baseline.  Psychiatric:        Mood and Affect: Mood normal.        Behavior: Behavior normal.     Lab Results  Component Value Date   WBC 6.4 05/01/2024   HGB 15.2 05/01/2024   HCT 46.0 05/01/2024   PLT 260.0 05/01/2024   GLUCOSE 113 (H) 05/01/2024   CHOL 133 05/01/2024   TRIG 88.0 05/01/2024   HDL 61.80 05/01/2024   LDLCALC 54 05/01/2024   ALT 14 05/01/2024   AST 17 05/01/2024   NA 139 05/01/2024   K 4.5 05/01/2024   CL 104 05/01/2024   CREATININE 0.88 05/01/2024   BUN 13 05/01/2024   CO2 28 05/01/2024   TSH 1.54 05/01/2024   PSA 1.91 11/19/2019   HGBA1C 6.5 05/01/2024    MR BRAIN W WO CONTRAST Result Date: 11/01/2018  [facility] [address] [place], [place] [address] [phone] NEUROIMAGING REPORT STUDY DATE: 10/31/2018 PATIENT NAME: Connor Johnson DOB: 05/07/1941 MRN: [id number] EXAM: MRI Brain with and without contrast ORDERING CLINICIAN: Onetha Epp, MD CLINICAL HISTORY: 83 year old man with encephalopathy, memory loss and confusion COMPARISON FILMS: MRI 06/10/2009 TECHNIQUE:MRI of the brain with and without contrast was obtained utilizing 5 mm axial slices with T1, T2, T2 flair, SWI and diffusion weighted views.  T1 sagittal, T2 coronal and postcontrast views in the axial and coronal plane were obtained. CONTRAST: 20 ml Multihance  IMAGING SITE: [facility], [address] [place]. FINDINGS: On sagittal images, the spinal cord is imaged caudally to C3 and is normal in caliber.   The contents of the posterior fossa are of normal size and position.   The pituitary gland and optic chiasm appear normal.    There is mild to moderate generalized cortical atrophy that has progressed compared to the 2010 MRI.  There are no abnormal extra-axial  collections of fluid.  There are a couple small T2/flair hyperintense foci in the pons and one in the right cerebellar hemisphere.  The deep gray matter appears normal.  In the hemispheres there are single and confluent T2/flair hyperintense foci with the largest confluencies in the periatrial white matter.  The white matter changes have only slightly changed compared to the 2010 MRI.  Diffusion weighted images are normal.  Susceptibility weighted images are normal.   The orbits appear normal.   The VIIth/VIIIth nerve complex appears normal.  There is a left mastoid effusion.  The right mastoid air cells appear normal.  The paranasal sinuses appear normal.  Flow voids are  identified within the major intracerebral arteries.  After the infusion of contrast material, a normal enhancement pattern is noted.   This MRI of the brain with and without contrast shows the following: 1.    Mild to moderate generalized cortical atrophy progressed compared to the 2010 MRI. 2.    White matter changes most consistent with moderately severe chronic microvascular ischemic change with large confluencies in the periatrial white matter.  This has just slightly progressed compared to the 2010 MRI. 3.    Left mastoid effusion, likely due to eustachian tube dysfunction INTERPRETING PHYSICIAN: Richard A. Vear, MD, PhD, FAAN Certified in  Neuroimaging by [employer]    Assessment & Plan:   Primary hypertension- BP is well controlled. -     Basic metabolic panel with GFR; Future -     CBC with Differential/Platelet; Future -     TSH; Future  Hyperlipidemia with target LDL less than 70- LDL goal achieved. Doing well on the statin  -     Rosuvastatin  Calcium ; Take 1 tablet (20 mg total) by mouth daily.  Dispense: 45 tablet; Refill: 1 -     Lipid panel; Future -     Hepatic function panel; Future -     TSH; Future  Prediabetes -     Basic metabolic panel with GFR; Future -     Hemoglobin A1c;  Future  Pulmonary emphysema, unspecified emphysema type (HCC)- Asx.  Type 2 diabetes mellitus with other diabetic arthropathy, without long-term current use of insulin (HCC)- A1C is 6.5%.     Follow-up: Return in about 6 months (around 11/01/2024).  Debby Molt, MD

## 2024-05-02 DIAGNOSIS — E119 Type 2 diabetes mellitus without complications: Secondary | ICD-10-CM | POA: Insufficient documentation

## 2024-05-08 ENCOUNTER — Ambulatory Visit

## 2024-05-08 VITALS — Ht 66.0 in | Wt 203.0 lb

## 2024-05-08 DIAGNOSIS — Z0000 Encounter for general adult medical examination without abnormal findings: Secondary | ICD-10-CM

## 2024-05-08 NOTE — Progress Notes (Signed)
 Subjective:   Connor Johnson is a 83 y.o. who presents for a Medicare Wellness preventive visit.  As a reminder, Annual Wellness Visits don't include a physical exam, and some assessments may be limited, especially if this visit is performed virtually. We may recommend an in-person follow-up visit with your provider if needed.  Visit Complete: Virtual I connected with  Connor Johnson on 05/08/24 by a audio enabled telemedicine application and verified that I am speaking with the correct person using two identifiers.  Patient Location: Home  Provider Location: Home Office  I discussed the limitations of evaluation and management by telemedicine. The patient expressed understanding and agreed to proceed.  Vital Signs: Because this visit was a virtual/telehealth visit, some criteria may be missing or patient reported. Any vitals not documented were not able to be obtained and vitals that have been documented are patient reported.  VideoDeclined- This patient declined Librarian, academic. Therefore the visit was completed with audio only.  Persons Participating in Visit: Patient.  AWV Questionnaire: No: Patient Medicare AWV questionnaire was not completed prior to this visit.  Cardiac Risk Factors include: advanced age (>1men, >57 women)     Objective:    Today's Vitals   05/08/24 0813  Weight: 203 lb (92.1 kg)  Height: 5' 6 (1.676 m)   Body mass index is 32.77 kg/m.     05/08/2024    8:24 AM 10/08/2020    1:46 PM 07/31/2017   11:39 AM 07/24/2016   12:58 PM 09/27/2015    8:26 AM 09/01/2015   11:42 AM  Advanced Directives  Does Patient Have a Medical Advance Directive? No Yes Yes  Yes  Yes  Yes   Type of Best boy of [place];Living will Healthcare Power of [place];Living will  Healthcare Power of Attorney    Does patient want to make changes to medical advance directive?  No - Patient declined  No - Patient declined     Copy  of Healthcare Power of Attorney in Chart?    Yes        Data saved with a previous flowsheet row definition    Current Medications (verified) Outpatient Encounter Medications as of 05/08/2024  Medication Sig   aspirin  81 MG EC tablet TAKE 1 TABLET(81 MG) BY MOUTH DAILY. SWALLOW WHOLE   rosuvastatin  (CRESTOR ) 20 MG tablet Take 1 tablet (20 mg total) by mouth daily.   No facility-administered encounter medications on file as of 05/08/2024.    Allergies (verified) Morphine and codeine and Hctz [hydrochlorothiazide]   History: Past Medical History:  Diagnosis Date   Arthritis    Cataract    COPD (chronic obstructive pulmonary disease) (HCC)    Diverticulitis    Diverticulosis    HLD (hyperlipidemia)    Hypertension    TIA (transient ischemic attack) 2013   temporary loss of vision in L eye    Past Surgical History:  Procedure Laterality Date   COLONOSCOPY     PENILE PROSTHESIS IMPLANT  07/30/2006   Dr. Deward Stalling   Family History  Problem Relation Age of Onset   Liver cancer Father    Alcoholism Father    Prostate cancer Brother    Diabetes Brother    Heart disease Paternal Grandmother    Heart disease Mother    Alcoholism Mother    Colon cancer Neg Hx    Esophageal cancer Neg Hx    Rectal cancer Neg Hx    Stomach cancer Neg  Hx    Dementia Neg Hx    Social History   Socioeconomic History   Marital status: Married    Spouse name: Macario   Number of children: 4   Years of education: 14.5   Highest education level: Not on file  Occupational History   Occupation: retired  Tobacco Use   Smoking status: Former    Current packs/day: 0.00    Types: Cigarettes    Quit date: 09/16/1995    Years since quitting: 28.6   Smokeless tobacco: Never  Vaping Use   Vaping status: Never Used  Substance and Sexual Activity   Alcohol use: Yes    Alcohol/week: 1.0 - 2.0 standard drink of alcohol    Types: 1 - 2 Glasses of wine per week   Drug use: No   Sexual activity: Yes   Other Topics Concern   Not on file  Social History Narrative   Lives at home with wife Connor Johnson/2025   Right handed   Caffeine: 4-7 cups daily   Social Drivers of Health   Financial Resource Strain: Low Risk  (05/08/2024)   Overall Financial Resource Strain (CARDIA)    Difficulty of Paying Living Expenses: Not hard at all  Food Insecurity: No Food Insecurity (05/08/2024)   Hunger Vital Sign    Worried About Running Out of Food in the Last Year: Never true    Ran Out of Food in the Last Year: Never true  Transportation Needs: No Transportation Needs (05/08/2024)   PRAPARE - Administrator, Civil Service (Medical): No    Lack of Transportation (Non-Medical): No  Physical Activity: Sufficiently Active (05/08/2024)   Exercise Vital Sign    Days of Exercise per Week: 2 days    Minutes of Exercise per Session: 120 min  Stress: No Stress Concern Present (05/08/2024)   [employer] of Occupational Health - Occupational Stress Questionnaire    Feeling of Stress: Only a little  Social Connections: Moderately Isolated (05/08/2024)   Social Connection and Isolation Panel    Frequency of Communication with Friends and Family: More than three times a week    Frequency of Social Gatherings with Friends and Family: Twice a week    Attends Religious Services: Never    Database administrator or Organizations: No    Attends Engineer, structural: Never    Marital Status: Married    Tobacco Counseling Counseling given: Not Answered    Clinical Intake:  Pre-visit preparation completed: Yes  Pain : No/denies pain     BMI - recorded: 32.77 Nutritional Status: BMI > 30  Obese Nutritional Risks: None Diabetes: Yes CBG done?: No Did pt. bring in CBG monitor from home?: No  Lab Results  Component Value Date   HGBA1C 6.5 05/01/2024   HGBA1C 6.1 11/14/2023   HGBA1C 6.1 04/17/2023     How often do you need to have someone help you when you read instructions,  pamphlets, or other written materials from your doctor or pharmacy?: 1 - Never  Interpreter Needed?: No  Information entered by :: Jerine Surles, RMA   Activities of Daily Living     05/08/2024    8:22 AM  In your present state of health, do you have any difficulty performing the following activities:  Hearing? 1  Comment Wears hearing aides  Vision? 0  Difficulty concentrating or making decisions? 0  Walking or climbing stairs? 0  Dressing or bathing? 0  Doing errands, shopping? 0  Preparing  Food and eating ? N  Using the Toilet? N  In the past six months, have you accidently leaked urine? N  Do you have problems with loss of bowel control? N  Managing your Medications? N  Managing your Finances? N  Housekeeping or managing your Housekeeping? N    Patient Care Team: Joshua Debby CROME, MD as PCP - General (Internal Medicine) Avel Jon NOVAK, OD as Consulting Physician (Optometry)  I have updated your Care Teams any recent Medical Services you may have received from other providers in the past year.     Assessment:   This is a routine wellness examination for N[place].  Hearing/Vision screen Hearing Screening - Comments:: Wears hearing aides Vision Screening - Comments:: Wears eyeglasses for reading/[place]   Goals Addressed             This Visit's Progress    Patient Stated   On track    Stay as healthy as active as possible. Continue to exercise start to eat healthier by reading food labels, monitoring carbohydrates, increase water intake,  and fat. Enjoy life and family.        Depression Screen     05/01/2024   10:25 AM 11/14/2023   10:44 AM 04/17/2023    9:58 AM 11/30/2021   10:46 AM 10/08/2020    1:44 PM 11/19/2019   10:58 AM 08/06/2018   10:19 AM  PHQ 2/9 Scores  PHQ - 2 Score 0 0 0 0 0 0 1  PHQ- 9 Score 0  0        Fall Risk     05/08/2024    8:25 AM 05/01/2024   10:25 AM 11/14/2023   10:44 AM 04/17/2023    9:58 AM 11/30/2021   10:47 AM  Fall Risk    Falls in the past year? 0 0 1 0 0  Number falls in past yr: 0 0 0 0 0  Injury with Fall? 0 0 0 0 0  Risk for fall due to :  No Fall Risks No Fall Risks No Fall Risks No Fall Risks  Follow up Falls evaluation completed;Falls prevention discussed Falls evaluation completed Falls evaluation completed Falls evaluation completed Falls evaluation completed      Data saved with a previous flowsheet row definition    MEDICARE RISK AT HOME:  Medicare Risk at Home Any stairs in or around the home?: Yes (2 story home) If so, are there any without handrails?: No Home free of loose throw rugs in walkways, pet beds, electrical cords, etc?: Yes Adequate lighting in your home to reduce risk of falls?: Yes Life alert?: No Use of a cane, walker or w/c?: No Grab bars in the bathroom?: Yes Shower chair or bench in shower?: Yes Elevated toilet seat or a handicapped toilet?: Yes  TIMED UP AND GO:  Was the test performed?  No  Cognitive Function: Declined/Normal: No cognitive concerns noted by patient or family. Patient alert, oriented, able to answer questions appropriately and recall recent events. No signs of memory loss or confusion.    10/09/2018   11:03 AM 07/31/2017   11:41 AM  MMSE - Mini Mental State Exam  Orientation to time 5 5   Orientation to Place 5 5   Registration 3 3   Attention/ Calculation 3 5   Recall 3 2   Language- name 2 objects 2 2   Language- repeat 1 1  Language- follow 3 step command 3 3   Language- read & follow  direction 1 1   Write a sentence 1 1   Copy design 1 1   Total score 28 29      Data saved with a previous flowsheet row definition        10/08/2020    1:47 PM  6CIT Screen  What Year? 0 points  What month? 0 points  What time? 0 points  Count back from 20 0 points  Months in reverse 0 points  Repeat phrase 0 points  Total Score 0 points    Immunizations Immunization History  Administered Date(s) Administered   INFLUENZA, HIGH DOSE SEASONAL  PF 07/19/2016, 07/17/2017, 08/06/2018   Influenza-Unspecified 06/12/2015, 06/28/2020, 07/10/2021   Moderna Sars-Covid-2 Vaccination 11/20/2019, 12/23/2019, 10/07/2020   Pneumococcal Conjugate-13 07/19/2016   Pneumococcal Polysaccharide-23 07/17/2017, 04/17/2023   Tdap 05/21/2013   Zoster Recombinant(Shingrix ) 06/08/2021, 09/08/2021    Screening Tests Health Maintenance  Topic Date Due   FOOT EXAM  Never done   OPHTHALMOLOGY EXAM  Never done   Diabetic kidney evaluation - Urine ACR  Never done   Medicare Annual Wellness (AWV)  10/08/2021   INFLUENZA VACCINE  04/11/2024   DTaP/Tdap/Td (2 - Td or Tdap) 05/01/2025 (Originally 05/22/2023)   Colonoscopy  05/01/2025 (Originally 11/20/2019)   HEMOGLOBIN A1C  11/01/2024   Diabetic kidney evaluation - eGFR measurement  05/01/2025   Pneumococcal Vaccine: 50+ Years  Completed   Zoster Vaccines- Shingrix   Completed   HPV VACCINES  Aged Out   Meningococcal B Vaccine  Aged Out   COVID-19 Vaccine  Discontinued    Health Maintenance  Health Maintenance Due  Topic Date Due   FOOT EXAM  Never done   OPHTHALMOLOGY EXAM  Never done   Diabetic kidney evaluation - Urine ACR  Never done   Medicare Annual Wellness (AWV)  10/08/2021   INFLUENZA VACCINE  04/11/2024   Health Maintenance Items Addressed: Diabetic Foot Exam recommended, Labs Ordered: UACR, See Nurse Notes at the end of this note  Additional Screening:  Vision Screening: Recommended annual ophthalmology exams for early detection of glaucoma and other disorders of the eye. Would you like a referral to an eye doctor? No    Dental Screening: Recommended annual dental exams for proper oral hygiene  Community Resource Referral / Chronic Care Management: CRR required this visit?  No   CCM required this visit?  No   Plan:    I have personally reviewed and noted the following in the patient's chart:   Medical and social history Use of alcohol, tobacco or illicit drugs  Current  medications and supplements including opioid prescriptions. Patient is not currently taking opioid prescriptions. Functional ability and status Nutritional status Physical activity Advanced directives List of other physicians Hospitalizations, surgeries, and ER visits in previous 12 months Vitals Screenings to include cognitive, depression, and falls Referrals and appointments  In addition, I have reviewed and discussed with patient certain preventive protocols, quality metrics, and best practice recommendations. A written personalized care plan for preventive services as well as general preventive health recommendations were provided to patient.   Yunis Voorheis L Tenecia Ignasiak, CMA   05/08/2024   After Visit Summary: (Mail) Due to this being a telephonic visit, the after visit summary with patients personalized plan was offered to patient via mail   Notes: Patient is due for a Tdap.  He is also due for a foot exam and a UACR, which order has been placed for patient to get done during his next office visit with Dr. Joshua.  Patient stated that he has had a recent diabetic eye exam. I have sent a request for records out today.  He had no other concerns to address today.

## 2024-05-08 NOTE — Patient Instructions (Signed)
 Mr. Connor Johnson , Thank you for taking time out of your busy schedule to complete your Annual Wellness Visit with me. I enjoyed our conversation and look forward to speaking with you again next year. I, as well as your care team,  appreciate your ongoing commitment to your health goals. Please review the following plan we discussed and let me know if I can assist you in the future. Your Game plan/ To Do List    Follow up Visits: We will see or speak with you next year for your Next Medicare AWV with our clinical staff Have you seen your provider in the last 6 months (3 months if uncontrolled diabetes)? Yes.  Last office visit on 05/01/2024.  Clinician Recommendations:  Aim for 30 minutes of exercise or brisk walking, 6-8 glasses of water, and 5 servings of fruits and vegetables each day. You are due for a Tetanus vaccine and can get that done at your local pharmacy.  You are also due for a foot exam and a kidney evaluation (urine sample) and will have these done during your next office visit here.        This is a list of the screenings recommended for you:  Health Maintenance  Topic Date Due   Complete foot exam   Never done   Eye exam for diabetics  Never done   Yearly kidney health urinalysis for diabetes  Never done   Medicare Annual Wellness Visit  10/08/2021   Flu Shot  04/11/2024   DTaP/Tdap/Td vaccine (2 - Td or Tdap) 05/01/2025*   Colon Cancer Screening  05/01/2025*   Hemoglobin A1C  11/01/2024   Yearly kidney function blood test for diabetes  05/01/2025   Pneumococcal Vaccine for age over 45  Completed   Zoster (Shingles) Vaccine  Completed   HPV Vaccine  Aged Out   Meningitis B Vaccine  Aged Out   COVID-19 Vaccine  Discontinued  *Topic was postponed. The date shown is not the original due date.    Advanced directives: (Declined) Advance directive discussed with you today. Even though you declined this today, please call our office should you change your mind, and we can give you  the proper paperwork for you to fill out. Advance Care Planning is important because it:  [x]  Makes sure you receive the medical care that is consistent with your values, goals, and preferences  [x]  It provides guidance to your family and loved ones and reduces their decisional burden about whether or not they are making the right decisions based on your wishes.  Follow the link provided in your after visit summary or read over the paperwork we have mailed to you to help you started getting your Advance Directives in place. If you need assistance in completing these, please reach out to us  so that we can help you!  See attachments for Preventive Care and Fall Prevention Tips.

## 2025-05-14 ENCOUNTER — Ambulatory Visit
